# Patient Record
Sex: Male | Born: 1955
Health system: Southern US, Community
[De-identification: ages and names within clinical notes are randomized; demographics above are authoritative.]

## PROBLEM LIST (undated history)

## (undated) DIAGNOSIS — C801 Malignant (primary) neoplasm, unspecified: Secondary | ICD-10-CM

## (undated) DIAGNOSIS — N189 Chronic kidney disease, unspecified: Secondary | ICD-10-CM

## (undated) DIAGNOSIS — Z87448 Personal history of other diseases of urinary system: Secondary | ICD-10-CM

## (undated) DIAGNOSIS — R9431 Abnormal electrocardiogram [ECG] [EKG]: Secondary | ICD-10-CM

## (undated) DIAGNOSIS — R319 Hematuria, unspecified: Secondary | ICD-10-CM

## (undated) DIAGNOSIS — K219 Gastro-esophageal reflux disease without esophagitis: Secondary | ICD-10-CM

## (undated) DIAGNOSIS — E119 Type 2 diabetes mellitus without complications: Secondary | ICD-10-CM

## (undated) DIAGNOSIS — G4730 Sleep apnea, unspecified: Secondary | ICD-10-CM

## (undated) DIAGNOSIS — Z789 Other specified health status: Secondary | ICD-10-CM

## (undated) DIAGNOSIS — N35919 Unspecified urethral stricture, male, unspecified site: Secondary | ICD-10-CM

## (undated) DIAGNOSIS — I10 Essential (primary) hypertension: Secondary | ICD-10-CM

## (undated) HISTORY — PX: CHOLECYSTECTOMY: SHX55

## (undated) HISTORY — PX: OTHER SURGICAL HISTORY: SHX169

---

## 2005-12-06 ENCOUNTER — Ambulatory Visit: Admission: RE | Admit: 2005-12-06 | Discharge: 2006-03-06 | Payer: Self-pay | Admitting: Radiation Oncology

## 2011-11-12 ENCOUNTER — Ambulatory Visit (INDEPENDENT_AMBULATORY_CARE_PROVIDER_SITE_OTHER): Payer: BC Managed Care – PPO | Admitting: Urology

## 2011-11-12 DIAGNOSIS — C61 Malignant neoplasm of prostate: Secondary | ICD-10-CM

## 2011-11-12 DIAGNOSIS — N529 Male erectile dysfunction, unspecified: Secondary | ICD-10-CM

## 2011-11-12 DIAGNOSIS — IMO0002 Reserved for concepts with insufficient information to code with codable children: Secondary | ICD-10-CM

## 2011-11-12 DIAGNOSIS — R350 Frequency of micturition: Secondary | ICD-10-CM

## 2012-06-26 ENCOUNTER — Ambulatory Visit (INDEPENDENT_AMBULATORY_CARE_PROVIDER_SITE_OTHER): Payer: BC Managed Care – PPO | Admitting: Urology

## 2012-06-26 DIAGNOSIS — IMO0002 Reserved for concepts with insufficient information to code with codable children: Secondary | ICD-10-CM

## 2012-07-07 ENCOUNTER — Other Ambulatory Visit: Payer: Self-pay | Admitting: Urology

## 2012-07-09 ENCOUNTER — Encounter (HOSPITAL_COMMUNITY): Payer: Self-pay | Admitting: Pharmacy Technician

## 2012-07-10 ENCOUNTER — Ambulatory Visit (HOSPITAL_COMMUNITY)
Admission: RE | Admit: 2012-07-10 | Discharge: 2012-07-10 | Disposition: A | Discharge status: Home / Self Care | Payer: BC Managed Care – PPO | Source: Ambulatory Visit | Attending: Urology | Admitting: Urology

## 2012-07-10 ENCOUNTER — Encounter (HOSPITAL_COMMUNITY): Payer: Self-pay

## 2012-07-10 ENCOUNTER — Encounter (HOSPITAL_COMMUNITY)
Admission: RE | Admit: 2012-07-10 | Discharge: 2012-07-10 | Disposition: A | Discharge status: Home / Self Care | Payer: BC Managed Care – PPO | Source: Ambulatory Visit | Attending: Urology | Admitting: Urology

## 2012-07-10 DIAGNOSIS — Z01810 Encounter for preprocedural cardiovascular examination: Secondary | ICD-10-CM | POA: Insufficient documentation

## 2012-07-10 DIAGNOSIS — I498 Other specified cardiac arrhythmias: Secondary | ICD-10-CM | POA: Insufficient documentation

## 2012-07-10 DIAGNOSIS — I4949 Other premature depolarization: Secondary | ICD-10-CM | POA: Insufficient documentation

## 2012-07-10 DIAGNOSIS — R9431 Abnormal electrocardiogram [ECG] [EKG]: Secondary | ICD-10-CM | POA: Insufficient documentation

## 2012-07-10 DIAGNOSIS — Z01812 Encounter for preprocedural laboratory examination: Secondary | ICD-10-CM | POA: Insufficient documentation

## 2012-07-10 DIAGNOSIS — Z01818 Encounter for other preprocedural examination: Secondary | ICD-10-CM | POA: Insufficient documentation

## 2012-07-10 HISTORY — DX: Malignant (primary) neoplasm, unspecified: C80.1

## 2012-07-10 HISTORY — DX: Chronic kidney disease, unspecified: N18.9

## 2012-07-10 HISTORY — DX: Unspecified urethral stricture, male, unspecified site: N35.919

## 2012-07-10 HISTORY — DX: Gastro-esophageal reflux disease without esophagitis: K21.9

## 2012-07-10 HISTORY — DX: Essential (primary) hypertension: I10

## 2012-07-10 LAB — BASIC METABOLIC PANEL
BUN: 18 mg/dL (ref 6–23)
CO2: 27 mEq/L (ref 19–32)
Chloride: 106 mEq/L (ref 96–112)
GFR calc Af Amer: 54 mL/min — ABNORMAL LOW (ref >90)
Potassium: 3.9 mEq/L (ref 3.5–5.1)

## 2012-07-10 LAB — CBC
HCT: 38.4 % — ABNORMAL LOW (ref 39.0–52.0)
Platelets: 208 10*3/uL (ref 150–400)
RBC: 4.3 MIL/uL (ref 4.22–5.81)
RDW: 12.8 % (ref 11.5–15.5)
WBC: 4.9 10*3/uL (ref 4.0–10.5)

## 2012-07-10 LAB — SURGICAL PCR SCREEN: Staphylococcus aureus: NEGATIVE

## 2012-07-10 NOTE — Patient Instructions (Signed)
YOUR SURGERY IS SCHEDULED AT [facility]  ON:  Thursday  12/26  REPORT TO [place] [facility] AT:  10:00 AM      PHONE # FOR SHORT STAY IS [phone]  DO NOT EAT ANYTHING AFTER MIDNIGHT THE NIGHT BEFORE YOUR SURGERY.   NO FOOD, NO CHEWING GUM, NO MINTS, NO CANDIES, NO CHEWING TOBACCO. YOU MAY HAVE CLEAR LIQUIDS TO DRINK FROM MIDNIGHT THE NIGHT BEFORE YOUR SURGERY - UNTIL 6:00 AM DAY OF SURGERY - LIKE WATER, SODA.   DO NOT DRINK ANYTHING AFTER 6:00 AM DAY OF SURGERY.  PLEASE TAKE THE FOLLOWING MEDICATIONS THE AM OF YOUR SURGERY WITH A FEW SIPS OF WATER:  AMLODIPINE AND CARVEDILOL.  IF YOU USE INHALERS--USE YOUR INHALERS THE AM OF YOUR SURGERY AND BRING INHALERS TO THE HOSPITAL -TAKE TO SURGERY.    IF YOU ARE DIABETIC:  DO NOT TAKE ANY DIABETIC MEDICATIONS THE AM OF YOUR SURGERY.  IF YOU TAKE INSULIN IN THE EVENINGS--PLEASE ONLY TAKE 1/2 NORMAL EVENING DOSE THE NIGHT BEFORE YOUR SURGERY.  NO INSULIN THE AM OF YOUR SURGERY.  IF YOU HAVE SLEEP APNEA AND USE CPAP OR BIPAP--PLEASE BRING THE MASK AND THE TUBING.  DO NOT BRING YOUR MACHINE.  DO NOT BRING VALUABLES, MONEY, CREDIT CARDS.  DO NOT WEAR JEWELRY, MAKE-UP, NAIL POLISH AND NO METAL PINS OR CLIPS IN YOUR HAIR. CONTACT LENS, DENTURES / PARTIALS, GLASSES SHOULD NOT BE WORN TO SURGERY AND IN MOST CASES-HEARING AIDS WILL NEED TO BE REMOVED.  BRING YOUR GLASSES CASE, ANY EQUIPMENT NEEDED FOR YOUR CONTACT LENS. FOR PATIENTS ADMITTED TO THE HOSPITAL--CHECK OUT TIME THE DAY OF DISCHARGE IS 11:00 AM.  ALL INPATIENT ROOMS ARE PRIVATE - WITH BATHROOM, TELEPHONE, TELEVISION AND WIFI INTERNET.  IF YOU ARE BEING DISCHARGED THE SAME DAY OF YOUR SURGERY--YOU CAN NOT DRIVE YOURSELF HOME--AND SHOULD NOT GO HOME ALONE BY TAXI OR BUS.  NO DRIVING OR OPERATING MACHINERY FOR 24 HOURS FOLLOWING ANESTHESIA / PAIN MEDICATIONS.  PLEASE MAKE ARRANGEMENTS FOR SOMEONE TO BE WITH YOU AT HOME THE FIRST 24 HOURS AFTER SURGERY. RESPONSIBLE DRIVER'S NAME   WIFE WILL BE WITH PT-BUT DOES NOT DRIVE--PATIENT WILL MAKE ARRANGEMENTS FOR SOMEONE TO DRIVE HIM HOME AFTER SURGERY.                                                                          PLEASE READ OVER ANY  FACT SHEETS THAT YOU WERE GIVEN: MRSA INFORMATION, BLOOD TRANSFUSION INFORMATION, INCENTIVE SPIROMETER INFORMATION. FAILURE TO FOLLOW THESE INSTRUCTIONS MAY RESULT IN THE CANCELLATION OF YOUR SURGERY.   PATIENT SIGNATURE_________________________________

## 2012-07-10 NOTE — Progress Notes (Signed)
07/10/12 1344  OBSTRUCTIVE SLEEP APNEA  Have you ever been diagnosed with sleep apnea through a sleep study? No  Do you snore loudly (loud enough to be heard through closed doors)?  1  Do you often feel tired, fatigued, or sleepy during the daytime? 1  Has anyone observed you stop breathing during your sleep? 0  Do you have, or are you being treated for high blood pressure? 1  BMI more than 35 kg/m2? 1  Age over 58 years old? 1  Neck circumference greater than 40 cm/18 inches? 1  Gender: 1  Obstructive Sleep Apnea Score 7   Score 4 or greater  Results sent to PCP

## 2012-07-10 NOTE — Pre-Procedure Instructions (Signed)
PREOP CBC, BMET AND EKG AND CXR WERE DONE TODAY - PREOP AT [facility] AS PER ANESTHESIOLOGIST'S GUIDELINES.

## 2012-07-10 NOTE — Pre-Procedure Instructions (Signed)
PT'S PREOP PRELIMINARY EKG DONE TODAY AT [facility] SHOWN TO DR. ROSE--NO OLD EKG IN EPIC FOR COMPARISON.  DR. ROSE REQUESTED THAT I FAX TO PT'S MEDICAL DOCTOR AND ASK IF ANY OLD EKG FOR COMPARISON.  I HAVE FAXED EKG AND NOTE TO DR. D. TAPPER'S OFFICE ASKING FOR ANY OLD EKG FOR COMPARISON.   I FAXED PT'S PREOP BMET REPORT TO DR. Annabell Howells - CREAT 1.59 AND FAXED PREOP EKG TO DR. Annabell Howells WITH NOTE THAT I AM CHECKING WITH DR. TAPPER -PT'S MEDICAL DOCTOR FOR ANY OLD EKG FOR COMPARISON.

## 2012-07-13 ENCOUNTER — Encounter (HOSPITAL_COMMUNITY): Payer: Self-pay

## 2012-07-13 NOTE — Pre-Procedure Instructions (Signed)
PT'S MEDICAL DOCTOR FAXED NOTE THAT HE DID NOT HAVE ANY EKG'S FOR COMPARISON. PT'S MOST RECENT EKGS FROM [facility] IN [place] FROM 2008 RECEIVED AND SHOWN TO DR. EWELL ALONG WITH PREOP EKG--DR. EWELL STATES ANESTHESIOLOGIST DAY OF SURGERY WILL TALK WITH PT AND REVIEW THE EKGS. PT'S MOST RECENT OFFICE NOTE OF 05/26/12 FROM DR. BEFEKADU-PT'S NEPHROLOGIST - ON PT'S CHART.

## 2012-07-14 NOTE — H&P (Signed)
ctive Problems 1. Balanitis 607.1 2. Organic Impotence 607.84 3. Prostate Cancer 185 4. Urethral Stricture 598.9 5. Urinary Frequency 788.41  History of Present Illness     [name] returns today with a one month history of progressive voiding difficulty with straining to void and a very slow stream.  He has no dysuria or hematuria.  He has a history of urethral strictures with prior dilations in the office. He has no associated signs or symptoms.     He has a history of T1c prostate cancer.   He was initially diagnosed with adenocarcinoma prostate on biopsy April 2007 by Dr. Jesse Werner. PSA was 5.3. He was referred to Dr. Chipper Werner who did external radiation therapy completing this August 2007. His PSA prior to this visit is 0.24 which is a further decline.  He had a history of retention post radiation and had a cystoscopy and dilation by Dr. Jerre Werner in 2009.  He had another dilation by Dr. Aldean Werner in 2010.  He has still has erections sufficient for intercourse and is no longer using Viagra.   Past Medical History 1. History of  Adult Sleep Apnea 780.57 2. History of  Arthritis V13.4 3. History of  Diabetes Mellitus 250.00 4. History of  Gross Hematuria 599.71 5. History of  Hypercholesterolemia 272.0 6. History of  Hypertension 401.9 7. History of  Microscopic Hematuria 599.72 8. History of  Microscopic Hematuria 599.72 9. History of  Prostate Cancer V10.46 10. History of  Renal Failure 586 11. History of  Visit For: Exam Following Radiotherapy V67.1  Surgical History 1. History of  Hand Surgery 2. History of  Hip Surgery 3. History of  Knee Surgery  Current Meds 1. AmLODIPine Besylate TABS; Therapy: (Recorded:06Dec2013) to 2. Aspirin 81 MG Oral Tablet; Therapy: (Recorded:16Aug2010) to 3. BD Insulin Syr Ultrafine II 31G X 5/16" 0.5 ML Miscellaneous; Therapy: 08Jan2011 to 4. Benazepril HCl 20 MG Oral Tablet; Therapy: (Recorded:16Aug2010) to 5. Carvedilol 6.25 MG Oral Tablet;  Therapy: (Recorded:16Aug2010) to 6. Furosemide 80 MG Oral Tablet; Therapy: (Recorded:16Aug2010) to 7. Insulin; Therapy: (Recorded:16Aug2010) to 8. Lantus SoloStar SOLN; Therapy: (Recorded:23Apr2013) to 9. Meloxicam 7.5 MG Oral Tablet; Therapy: 14Feb2012 to 10. Nystatin-Triamcinolone 100000-0.1 UNIT/GM-% External Ointment; APPLY SPARINGLY TO   AFFECTED AREA TWICE A DAY FOR 7 TO 14 DAYS; Therapy: 04Apr2012 to (Last Rx:04Apr2012) 11. Polymyxin B-Trimethoprim 10000-0.1 UNIT/ML-% Ophthalmic Solution; Therapy: 03Feb2012 to 12. Pravastatin Sodium 40 MG Oral Tablet; Therapy: 04Sep2010 to 13. Viagra 100 MG Oral Tablet; TAKE 1 TABLET PRN PRN; Therapy: 22Nov2010 to (Last   Rx:22Nov2010)  Allergies 1. No Known Drug Allergies  Family History 1. Maternal history of  Death In The Family Father 2. Maternal history of  Death In The Family Mother 3. Family history of  Diabetes Mellitus V18.0 4. Maternal history of  Family Health Status Number Of Children 5. Paternal history of  Hypertension V17.49  Social History 1. Caffeine Use 2-3 soda per day 2. Marital History - Currently Married 3. Occupation: spinner in [employer]  4. History of  Alcohol Use 5. History of  Tobacco Use  Review of Systems  Gastrointestinal: no abdominal pain.  Constitutional: no fever.  Cardiovascular: leg swelling, but no chest pain.  Respiratory: no shortness of breath.    Vitals Vital Signs [Data Includes: Last 1 Day]  06Dec2013 03:14PM  Blood Pressure: 152 / 74 Temperature: 97.9 F Heart Rate: 64  Physical Exam Constitutional: Well nourished and well developed . No acute distress.  Pulmonary: No respiratory distress  and normal respiratory rhythm and effort.  Cardiovascular: Heart rate and rhythm are normal . No peripheral edema.  Abdomen: The abdomen is obese. The abdomen is soft and nontender.  No inguinal hernia is present on the right.  No inguinal hernia is present on the left.  Genitourinary:  Examination of the penis demonstrates no discharge, no masses, no lesions and a normal meatus. The scrotum is without lesions. The right epididymis is palpably normal and non-tender. The left epididymis is palpably normal and non-tender. The right testis is non-tender and without masses. The left testis is non-tender and without masses.    Results/Data Urine [Data Includes: Last 1 Day]   06Dec2013  COLOR YELLOW   APPEARANCE CLEAR   SPECIFIC GRAVITY 1.010   pH 5.5   GLUCOSE NEG mg/dL  BILIRUBIN NEG   KETONE NEG mg/dL  BLOOD TRACE   PROTEIN NEG mg/dL  UROBILINOGEN 0.2 mg/dL  NITRITE NEG   LEUKOCYTE ESTERASE NEG   SQUAMOUS EPITHELIAL/HPF NONE SEEN   WBC NONE SEEN WBC/hpf  RBC 3-6 RBC/hpf  BACTERIA NONE SEEN   CRYSTALS NONE SEEN   CASTS NONE SEEN    Flow Rate: Voided 104 ml. A peak flow rate of 82ml/s, mean flow rate of 8ml/s and sustained but very slow. flow curve .  PVR: Ultrasound PVR 120 ml.    Procedure  Procedure: Cystoscopy   Indication: Lower Urinary Tract Symptoms.  Prep: The patient was prepped with betadine.  Anesthesia:. Local anesthesia was administered intraurethrally with 2% lidocaine jelly.  Antibiotic prophylaxis: Ciprofloxacin.  Procedure Note:  Urethral meatus:. No abnormalities.  Anterior urethra:. A severe stricture was present (in the distal bulb there were sequential strictures with the more proximal being about 6 fr. After the scope was passed a flap was raised and I could not pass a wire beyond the obliterated area. The procedure was abort and fortunately he was able to void better than prior to the cysto attempt. ) in the bulbar urethra and an attempt was made to dilate the stricture but was unsuccessful.  Prostatic urethra:. Not seen.  Bladder: Not seen. Not seen. The patient tolerated the procedure well.  Complications: Bleeding. False passage.    Assessment 1. Urethral Stricture 598.9   He has a severe stricture that will need management in the OR  with balloon dilation.   He was able to void after the procedure.   Plan Prostate Cancer (185)  1. UA With REFLEX  Done: 06Dec2013 03:24PM   Because he was able to void better post procedure, I am going to schedule him electively for cystoscopy with urethral dilation in the OR in [place] in the next one - two weeks.  I have told him to go to the [facility] ER if he can't pee and to let them know I said he would need a foley placed in the OR.  I reviewed the risks of the procedure including bleeding, infection, urethral and penile injury, need for SP tube and secondary procedures, thrombotic events and anesthetic complications.   Discussion/Summary   CC: Dr. Burns Spain

## 2012-07-16 ENCOUNTER — Ambulatory Visit (HOSPITAL_COMMUNITY): Payer: BC Managed Care – PPO | Admitting: Anesthesiology

## 2012-07-16 ENCOUNTER — Encounter (HOSPITAL_COMMUNITY)
Admission: RE | Disposition: A | Discharge status: Home / Self Care | Payer: Self-pay | Source: Ambulatory Visit | Attending: Urology

## 2012-07-16 ENCOUNTER — Encounter (HOSPITAL_COMMUNITY): Payer: Self-pay | Admitting: *Deleted

## 2012-07-16 ENCOUNTER — Encounter (HOSPITAL_COMMUNITY): Payer: Self-pay | Admitting: Anesthesiology

## 2012-07-16 ENCOUNTER — Ambulatory Visit (HOSPITAL_COMMUNITY)
Admission: RE | Admit: 2012-07-16 | Discharge: 2012-07-16 | Disposition: A | Discharge status: Home / Self Care | Payer: BC Managed Care – PPO | Source: Ambulatory Visit | Attending: Urology | Admitting: Urology

## 2012-07-16 DIAGNOSIS — N476 Balanoposthitis: Secondary | ICD-10-CM | POA: Insufficient documentation

## 2012-07-16 DIAGNOSIS — I10 Essential (primary) hypertension: Secondary | ICD-10-CM | POA: Insufficient documentation

## 2012-07-16 DIAGNOSIS — Z794 Long term (current) use of insulin: Secondary | ICD-10-CM | POA: Insufficient documentation

## 2012-07-16 DIAGNOSIS — E7800 Pure hypercholesterolemia, unspecified: Secondary | ICD-10-CM | POA: Insufficient documentation

## 2012-07-16 DIAGNOSIS — Z79899 Other long term (current) drug therapy: Secondary | ICD-10-CM | POA: Insufficient documentation

## 2012-07-16 DIAGNOSIS — N35919 Unspecified urethral stricture, male, unspecified site: Secondary | ICD-10-CM | POA: Insufficient documentation

## 2012-07-16 DIAGNOSIS — R350 Frequency of micturition: Secondary | ICD-10-CM | POA: Insufficient documentation

## 2012-07-16 DIAGNOSIS — N529 Male erectile dysfunction, unspecified: Secondary | ICD-10-CM | POA: Insufficient documentation

## 2012-07-16 DIAGNOSIS — C61 Malignant neoplasm of prostate: Secondary | ICD-10-CM | POA: Insufficient documentation

## 2012-07-16 DIAGNOSIS — E119 Type 2 diabetes mellitus without complications: Secondary | ICD-10-CM | POA: Insufficient documentation

## 2012-07-16 DIAGNOSIS — G4730 Sleep apnea, unspecified: Secondary | ICD-10-CM | POA: Insufficient documentation

## 2012-07-16 DIAGNOSIS — Z7982 Long term (current) use of aspirin: Secondary | ICD-10-CM | POA: Insufficient documentation

## 2012-07-16 HISTORY — PX: CYSTOSCOPY WITH URETHRAL DILATATION: SHX5125

## 2012-07-16 LAB — GLUCOSE, CAPILLARY
Glucose-Capillary: 221 mg/dL — ABNORMAL HIGH (ref 70–99)
Glucose-Capillary: 218 mg/dL — ABNORMAL HIGH (ref 70–99)

## 2012-07-16 SURGERY — CYSTOSCOPY, WITH URETHRAL DILATION
Anesthesia: General | Wound class: Clean Contaminated

## 2012-07-16 MED ORDER — ACETAMINOPHEN 10 MG/ML IV SOLN
INTRAVENOUS | Status: AC
  Filled 2012-07-16: qty 100

## 2012-07-16 MED ORDER — STERILE WATER FOR IRRIGATION IR SOLN
Status: DC | PRN
  Administered 2012-07-16: 10 mL

## 2012-07-16 MED ORDER — INSULIN ASPART 100 UNIT/ML ~~LOC~~ SOLN
0.0000 [IU] | SUBCUTANEOUS | Status: DC
  Administered 2012-07-16: 5 [IU] via SUBCUTANEOUS
  Filled 2012-07-16: qty 1

## 2012-07-16 MED ORDER — CIPROFLOXACIN IN D5W 400 MG/200ML IV SOLN
400.0000 mg | INTRAVENOUS | Status: AC
  Administered 2012-07-16: 400 mg via INTRAVENOUS

## 2012-07-16 MED ORDER — ONDANSETRON HCL 4 MG/2ML IJ SOLN
INTRAMUSCULAR | Status: DC | PRN
  Administered 2012-07-16: 4 mg via INTRAVENOUS

## 2012-07-16 MED ORDER — IOHEXOL 300 MG/ML  SOLN
INTRAMUSCULAR | Status: DC | PRN
  Administered 2012-07-16: 50 mL

## 2012-07-16 MED ORDER — 0.9 % SODIUM CHLORIDE (POUR BTL) OPTIME
TOPICAL | Status: DC | PRN
  Administered 2012-07-16: 1000 mL

## 2012-07-16 MED ORDER — HYDROCODONE-ACETAMINOPHEN 5-325 MG PO TABS: 1.0000 | ORAL_TABLET | Freq: Four times a day (QID) | ORAL | Status: DC | PRN

## 2012-07-16 MED ORDER — LACTATED RINGERS IV SOLN: INTRAVENOUS | Status: DC

## 2012-07-16 MED ORDER — LACTATED RINGERS IV SOLN
INTRAVENOUS | Status: DC
  Administered 2012-07-16: 1000 mL via INTRAVENOUS

## 2012-07-16 MED ORDER — LIDOCAINE HCL 1 % IJ SOLN
INTRAMUSCULAR | Status: DC | PRN
  Administered 2012-07-16: 80 mg via INTRADERMAL

## 2012-07-16 MED ORDER — LACTATED RINGERS IV SOLN
INTRAVENOUS | Status: DC | PRN
  Administered 2012-07-16: 11:00:00 via INTRAVENOUS

## 2012-07-16 MED ORDER — PROPOFOL 10 MG/ML IV EMUL
INTRAVENOUS | Status: DC | PRN
  Administered 2012-07-16: 250 mg via INTRAVENOUS

## 2012-07-16 MED ORDER — IOHEXOL 300 MG/ML  SOLN
INTRAMUSCULAR | Status: AC
  Filled 2012-07-16: qty 2

## 2012-07-16 MED ORDER — CIPROFLOXACIN IN D5W 400 MG/200ML IV SOLN
INTRAVENOUS | Status: AC
  Filled 2012-07-16: qty 200

## 2012-07-16 MED ORDER — FENTANYL CITRATE 0.05 MG/ML IJ SOLN
INTRAMUSCULAR | Status: DC | PRN
  Administered 2012-07-16: 50 ug via INTRAVENOUS
  Administered 2012-07-16: 25 ug via INTRAVENOUS
  Administered 2012-07-16: 50 ug via INTRAVENOUS

## 2012-07-16 MED ORDER — FENTANYL CITRATE 0.05 MG/ML IJ SOLN: 25.0000 ug | INTRAMUSCULAR | Status: DC | PRN

## 2012-07-16 MED ORDER — SODIUM CHLORIDE 0.9 % IR SOLN
Status: DC | PRN
  Administered 2012-07-16: 3000 mL

## 2012-07-16 MED ORDER — ACETAMINOPHEN 10 MG/ML IV SOLN
INTRAVENOUS | Status: DC | PRN
  Administered 2012-07-16: 1000 mg via INTRAVENOUS

## 2012-07-16 SURGICAL SUPPLY — 15 items
BAG URINE DRAINAGE (UROLOGICAL SUPPLIES) ×2 IMPLANT
BAG URO CATCHER STRL LF (DRAPE) ×2 IMPLANT
BALLN NEPHROSTOMY (BALLOONS) ×2
BALLOON NEPHROSTOMY (BALLOONS) ×1 IMPLANT
CATH FOLEY 2W COUNCIL 5CC 18FR (CATHETERS) ×2 IMPLANT
CATH ROBINSON RED A/P 16FR (CATHETERS) IMPLANT
CLOTH BEACON ORANGE TIMEOUT ST (SAFETY) ×2 IMPLANT
DRAPE CAMERA CLOSED 9X96 (DRAPES) ×2 IMPLANT
GLOVE SURG SS PI 8.0 STRL IVOR (GLOVE) ×2 IMPLANT
GOWN PREVENTION PLUS XLARGE (GOWN DISPOSABLE) ×2 IMPLANT
GOWN STRL REIN XL XLG (GOWN DISPOSABLE) ×2 IMPLANT
GUIDEWIRE STR DUAL SENSOR (WIRE) ×2 IMPLANT
MANIFOLD NEPTUNE II (INSTRUMENTS) ×2 IMPLANT
PACK CYSTO (CUSTOM PROCEDURE TRAY) ×2 IMPLANT
TUBING CONNECTING 10 (TUBING) ×2 IMPLANT

## 2012-07-16 NOTE — Anesthesia Postprocedure Evaluation (Signed)
  Anesthesia Post-op Note  Patient: Seth Werner  Procedure(s) Performed: Procedure(s) (LRB): CYSTOSCOPY WITH URETHRAL DILATATION (N/A)  Patient Location: PACU  Anesthesia Type: General  Level of Consciousness: awake and alert   Airway and Oxygen Therapy: Patient Spontanous Breathing  Post-op Pain: mild  Post-op Assessment: Post-op Vital signs reviewed, Patient's Cardiovascular Status Stable, Respiratory Function Stable, Patent Airway and No signs of Nausea or vomiting  Last Vitals:  Filed Vitals:   07/16/12 1230  BP: 130/70  Pulse: 50  Temp:   Resp: 14    Post-op Vital Signs: stable   Complications: No apparent anesthesia complications

## 2012-07-16 NOTE — Anesthesia Preprocedure Evaluation (Addendum)
Anesthesia Evaluation  Patient identified by MRN, date of birth, ID band Patient awake    Reviewed: Allergy & Precautions, H&P , NPO status , Patient's Chart, lab work & pertinent test results, reviewed documented beta blocker date and time   Airway Mallampati: III TM Distance: >3 FB Neck ROM: full    Dental No notable dental hx. (+) Teeth Intact and Dental Advisory Given   Pulmonary neg pulmonary ROS,  breath sounds clear to auscultation  Pulmonary exam normal       Cardiovascular Exercise Tolerance: Good hypertension, Pt. on medications and Pt. on home beta blockers negative cardio ROS  Rhythm:regular Rate:Normal     Neuro/Psych negative neurological ROS  negative psych ROS   GI/Hepatic negative GI ROS, Neg liver ROS, GERD-  Controlled,  Endo/Other  negative endocrine ROSdiabetes, Well Controlled, Type 2, Insulin Dependent  Renal/GU Renal InsufficiencyRenal diseasenegative Renal ROSKidneys improving now  negative genitourinary   Musculoskeletal   Abdominal   Peds  Hematology negative hematology ROS (+)   Anesthesia Other Findings   Reproductive/Obstetrics negative OB ROS                          Anesthesia Physical Anesthesia Plan  ASA: III  Anesthesia Plan: General   Post-op Pain Management:    Induction: Intravenous  Airway Management Planned: LMA  Additional Equipment:   Intra-op Plan:   Post-operative Plan:   Informed Consent: I have reviewed the patients History and Physical, chart, labs and discussed the procedure including the risks, benefits and alternatives for the proposed anesthesia with the patient or authorized representative who has indicated his/her understanding and acceptance.   Dental Advisory Given  Plan Discussed with: CRNA and Surgeon  Anesthesia Plan Comments:         Anesthesia Quick Evaluation

## 2012-07-16 NOTE — Interval H&P Note (Signed)
History and Physical Interval Note:  07/16/2012 11:15 AM  Seth Werner  has presented today for surgery, with the diagnosis of URETHRAL STRICTURE  The various methods of treatment have been discussed with the patient and family. After consideration of risks, benefits and other options for treatment, the patient has consented to  Procedure(s) (LRB) with comments: CYSTOSCOPY WITH URETHRAL DILATATION (N/A) - CYSTO URETHRAL BALLOON DILATATION  as a surgical intervention .  The patient's history has been reviewed, patient examined, no change in status, stable for surgery.  I have reviewed the patient's chart and labs.  Questions were answered to the patient's satisfaction.     [name],[name]

## 2012-07-16 NOTE — Brief Op Note (Signed)
07/16/2012  12:10 PM  PATIENT:  Seth Werner  56 y.o. male  PRE-OPERATIVE DIAGNOSIS:  URETHRAL STRICTURE  POST-OPERATIVE DIAGNOSIS:  URETHRAL STRICTURE  PROCEDURE:  Procedure(s) (LRB) with comments: CYSTOSCOPY WITH URETHRAL DILATATION (N/A) - CYSTO URETHRAL BALLOON DILATATION   SURGEON:  Surgeon(s) and Role:    * Anner Crete, MD - Primary  PHYSICIAN ASSISTANT:   ASSISTANTS: none   ANESTHESIA:   general  EBL:     BLOOD ADMINISTERED:none  DRAINS: Urinary Catheter (Foley)   LOCAL MEDICATIONS USED:  NONE  SPECIMEN:  No Specimen  DISPOSITION OF SPECIMEN:  N/A  COUNTS:  YES  TOURNIQUET:  * No tourniquets in log *  DICTATION: .Other Dictation: Dictation Number [id number]  PLAN OF CARE: Discharge to home after PACU  PATIENT DISPOSITION:  PACU - hemodynamically stable.   Delay start of Pharmacological VTE agent (>24hrs) due to surgical blood loss or risk of bleeding: not applicable

## 2012-07-16 NOTE — Transfer of Care (Signed)
Immediate Anesthesia Transfer of Care Note  Patient: Seth Werner  Procedure(s) Performed: Procedure(s) (LRB) with comments: CYSTOSCOPY WITH URETHRAL DILATATION (N/A) - CYSTO URETHRAL BALLOON DILATATION   Patient Location: PACU  Anesthesia Type:General  Level of Consciousness: awake  Airway & Oxygen Therapy: Patient Spontanous Breathing and Patient connected to face mask oxygen  Post-op Assessment: Report given to PACU RN and Post -op Vital signs reviewed and stable  Post vital signs: Reviewed and stable  Complications: No apparent anesthesia complications

## 2012-07-16 NOTE — Preoperative (Signed)
Beta Blockers   Reason not to administer Beta Blockers:Not Applicable 

## 2012-07-16 NOTE — Op Note (Signed)
NAMEMarland Kitchen  Seth Werner, Seth Werner NO.:  [id number]  MEDICAL RECORD NO.:  [id number]  LOCATION:  [facility]                         FACILITY:  [facility]  PHYSICIAN:  Taeden Geller. Annabell Howells, M.D.    DATE OF BIRTH:  1956-05-02  DATE OF PROCEDURE:  07/16/2012 DATE OF DISCHARGE:  07/16/2012                              OPERATIVE REPORT   PROCEDURE:  Cystoscopy with balloon dilation of urethral stricture.  PREOPERATIVE DIAGNOSIS:  Urethral stricture.  POSTOPERATIVE DIAGNOSIS:  Urethral stricture.  SURGEON:  Rocklin Soderquist. Annabell Howells, M.D.  ANESTHESIA:  General.  SPECIMEN:  None.  DRAINS:  An 18-French Council catheter.  COMPLICATIONS:  None.  INDICATIONS:  Seth Werner is a 56 year old African American male with history of prostate cancer, treated with external beam radiation therapy.  He subsequently has developed a severe bulbar urethral stricture, that has previously been dilated.  An attempt to evaluate the stricture in the office, the cystoscopy resulted an elevation of a mucosal flap, which prevented placement of a wire or attempted office dilation and he returns to the operating room today for definitive therapy.  FINDINGS OF PROCEDURE:  He was taken to the operating room where received Cipro.  General anesthetic was induced.  He was placed in lithotomy position.  His perineum and genitalia were prepped with Betadine solution.  He was draped in usual sterile fashion.  Cystoscopy was initially performed with a 22-French scope and 12-degree lens.  Examination revealed a normal meatus and distal one-third of the urethra.  Then, he began to have the presence of stricture that was not initially tight, but tapered in such a way that passage of the scope to near the tight spot of the stricture was not possible.  At this point, I took a 6.4-French short rigid ureteroscope and advanced it through the urethra to the stricture ostia and passed a guidewire into the bladder.  This was confirmed with  fluoroscopy.  A 15-cm 24- French high-pressure balloon was then passed over the wire into the bladder.  There were some resistance at the tightest part of the stricture, but I was able to advance it across the membranous urethra, and once in position, the balloon was dilated to 20 atmospheres and left in place for 3 minutes.  The balloon was then deflated.  Fluoroscopy had revealed disappearance of the waist.  Once the proximal urethra had been dilated, ran another dilation cycle on the mid and anterior urethra to be certain that the entire stricture had been disrupted.  At this point, the balloon was removed and endoscopy was repeated with the 22-French cystoscope.  I was able to advance the scope along the wire into the bladder.  Both the prostatic and proximal urethral areas were rather fixed, which made inspection of the bladder difficulty, I could see the entire bladder wall with the bladder decompressed, but not when full.  On removal of the scope, there was excellent disruption of the stricture, but the stricture was rather elongated approximately 3-4 cm in length with dense scar circumferentially.  After removal of the scope, an 18-French Council catheter was inserted over the wire without difficulty.  The balloon was filled with  10 mL of sterile fluid.  The wire was removed.  The fluid return was clear.  The catheter was placed to straight drainage.  The patient was taken down from lithotomy position.  His anesthetic was reversed and he was removed to the recovery room in stable condition.  There were no complications.     [name]. Annabell Howells, M.D.     JJW/MEDQ  D:  07/16/2012  T:  07/16/2012  Job:  [id number]

## 2012-07-17 ENCOUNTER — Encounter (HOSPITAL_COMMUNITY): Payer: Self-pay | Admitting: Urology

## 2012-08-14 ENCOUNTER — Ambulatory Visit (INDEPENDENT_AMBULATORY_CARE_PROVIDER_SITE_OTHER): Payer: BC Managed Care – PPO | Admitting: Urology

## 2012-08-14 DIAGNOSIS — IMO0002 Reserved for concepts with insufficient information to code with codable children: Secondary | ICD-10-CM

## 2012-08-14 DIAGNOSIS — C61 Malignant neoplasm of prostate: Secondary | ICD-10-CM

## 2012-09-18 ENCOUNTER — Ambulatory Visit (INDEPENDENT_AMBULATORY_CARE_PROVIDER_SITE_OTHER): Payer: BC Managed Care – PPO | Admitting: Urology

## 2012-09-18 DIAGNOSIS — N35919 Unspecified urethral stricture, male, unspecified site: Secondary | ICD-10-CM

## 2013-01-01 ENCOUNTER — Encounter (HOSPITAL_COMMUNITY): Admission: EM | Disposition: A | Discharge status: Home / Self Care | Payer: Self-pay | Source: Home / Self Care

## 2013-01-01 ENCOUNTER — Encounter (HOSPITAL_COMMUNITY): Payer: Self-pay | Admitting: Anesthesiology

## 2013-01-01 ENCOUNTER — Ambulatory Visit (INDEPENDENT_AMBULATORY_CARE_PROVIDER_SITE_OTHER): Payer: BC Managed Care – PPO | Admitting: Urology

## 2013-01-01 ENCOUNTER — Ambulatory Visit: Admit: 2013-01-01 | Payer: BC Managed Care – PPO | Admitting: Urology

## 2013-01-01 ENCOUNTER — Emergency Department (HOSPITAL_COMMUNITY)
Admission: EM | Admit: 2013-01-01 | Discharge: 2013-01-01 | Disposition: A | Discharge status: Home / Self Care | Payer: BC Managed Care – PPO | Attending: Urology | Admitting: Urology

## 2013-01-01 DIAGNOSIS — Z8546 Personal history of malignant neoplasm of prostate: Secondary | ICD-10-CM | POA: Insufficient documentation

## 2013-01-01 DIAGNOSIS — R339 Retention of urine, unspecified: Secondary | ICD-10-CM | POA: Insufficient documentation

## 2013-01-01 DIAGNOSIS — R338 Other retention of urine: Secondary | ICD-10-CM

## 2013-01-01 DIAGNOSIS — N35919 Unspecified urethral stricture, male, unspecified site: Secondary | ICD-10-CM | POA: Insufficient documentation

## 2013-01-01 DIAGNOSIS — IMO0002 Reserved for concepts with insufficient information to code with codable children: Secondary | ICD-10-CM

## 2013-01-01 HISTORY — PX: CYSTOSCOPY WITH URETHRAL DILATATION: SHX5125

## 2013-01-01 SURGERY — CYSTOSCOPY, WITH URETHRAL DILATION
Anesthesia: General | Wound class: Clean Contaminated

## 2013-01-01 MED ORDER — CIPROFLOXACIN HCL 500 MG PO TABS: 500.0000 mg | ORAL_TABLET | Freq: Two times a day (BID) | ORAL | Status: DC

## 2013-01-01 MED ORDER — SODIUM CHLORIDE 0.9 % IV SOLN: 250.0000 mL | INTRAVENOUS | Status: DC | PRN

## 2013-01-01 MED ORDER — LIDOCAINE HCL 1 % IJ SOLN
INTRAMUSCULAR | Status: DC | PRN
  Administered 2013-01-01: 50 mg via INTRADERMAL

## 2013-01-01 MED ORDER — OXYCODONE HCL 5 MG PO TABS: 5.0000 mg | ORAL_TABLET | ORAL | Status: DC | PRN

## 2013-01-01 MED ORDER — FENTANYL CITRATE 0.05 MG/ML IJ SOLN: 25.0000 ug | INTRAMUSCULAR | Status: DC | PRN

## 2013-01-01 MED ORDER — ACETAMINOPHEN 325 MG PO TABS: 650.0000 mg | ORAL_TABLET | ORAL | Status: DC | PRN

## 2013-01-01 MED ORDER — HYDROCODONE-ACETAMINOPHEN 5-325 MG PO TABS: 1.0000 | ORAL_TABLET | Freq: Four times a day (QID) | ORAL | Status: DC | PRN

## 2013-01-01 MED ORDER — SODIUM CHLORIDE 0.9 % IJ SOLN: 3.0000 mL | Freq: Two times a day (BID) | INTRAMUSCULAR | Status: DC

## 2013-01-01 MED ORDER — FENTANYL CITRATE 0.05 MG/ML IJ SOLN
INTRAMUSCULAR | Status: DC | PRN
  Administered 2013-01-01 (×2): 50 ug via INTRAVENOUS

## 2013-01-01 MED ORDER — ACETAMINOPHEN 650 MG RE SUPP: 650.0000 mg | RECTAL | Status: DC | PRN

## 2013-01-01 MED ORDER — DEXTROSE 5 % IV SOLN
INTRAVENOUS | Status: DC | PRN
  Administered 2013-01-01: 18:00:00 via INTRAVENOUS

## 2013-01-01 MED ORDER — MIDAZOLAM HCL 5 MG/5ML IJ SOLN
INTRAMUSCULAR | Status: DC | PRN
  Administered 2013-01-01: 2 mg via INTRAVENOUS

## 2013-01-01 MED ORDER — SODIUM CHLORIDE 0.9 % IJ SOLN: 3.0000 mL | INTRAMUSCULAR | Status: DC | PRN

## 2013-01-01 MED ORDER — SODIUM CHLORIDE 0.9 % IR SOLN
Status: DC | PRN
  Administered 2013-01-01: 3000 mL via INTRAVESICAL

## 2013-01-01 MED ORDER — ONDANSETRON HCL 4 MG/2ML IJ SOLN: 4.0000 mg | Freq: Four times a day (QID) | INTRAMUSCULAR | Status: DC | PRN

## 2013-01-01 MED ORDER — PROPOFOL 10 MG/ML IV BOLUS
INTRAVENOUS | Status: DC | PRN
  Administered 2013-01-01: 200 mg via INTRAVENOUS

## 2013-01-01 MED ORDER — SURGILUBE EX GEL
CUTANEOUS | Status: DC | PRN
  Administered 2013-01-01: 1 via TOPICAL

## 2013-01-01 MED ORDER — ONDANSETRON HCL 4 MG/2ML IJ SOLN
INTRAMUSCULAR | Status: DC | PRN
  Administered 2013-01-01: 4 mg via INTRAVENOUS

## 2013-01-01 MED ORDER — CIPROFLOXACIN IN D5W 400 MG/200ML IV SOLN
400.0000 mg | INTRAVENOUS | Status: AC
  Administered 2016-09-11: 400 mg via INTRAVENOUS

## 2013-01-01 MED ORDER — SODIUM CHLORIDE 0.9 % IV SOLN
INTRAVENOUS | Status: DC | PRN
  Administered 2013-01-01: 17:00:00 via INTRAVENOUS

## 2013-01-01 MED ORDER — CIPROFLOXACIN IN D5W 400 MG/200ML IV SOLN
INTRAVENOUS | Status: DC | PRN
  Administered 2013-01-01: 400 mg via INTRAVENOUS

## 2013-01-01 SURGICAL SUPPLY — 16 items
BAG DRAIN CYSTO-URO STER (DRAIN) ×2 IMPLANT
BAG URINE DRAINAGE (UROLOGICAL SUPPLIES) ×2 IMPLANT
BALLN KIT 18FRX10 (BALLOONS) ×2 IMPLANT
CATH COUDE FOLEY 2W 5CC 16FR (CATHETERS) ×2 IMPLANT
GLOVE BIOGEL PI IND STRL 6.5 (GLOVE) ×1 IMPLANT
GLOVE BIOGEL PI INDICATOR 6.5 (GLOVE) ×1
GLOVE EXAM NITRILE MD LF STRL (GLOVE) ×4 IMPLANT
GLOVE INDICATOR 7.0 STRL GRN (GLOVE) ×2 IMPLANT
GLOVE SS BIOGEL STRL SZ 6.5 (GLOVE) ×1 IMPLANT
GLOVE SUPERSENSE BIOGEL SZ 6.5 (GLOVE) ×1
GLOVE SURG SS PI 8.0 STRL IVOR (GLOVE) ×2 IMPLANT
GUIDEWIRE STR DUAL SENSOR (WIRE) ×2 IMPLANT
IV NS IRRIG 3000ML ARTHROMATIC (IV SOLUTION) ×2 IMPLANT
PACK CYSTO (CUSTOM PROCEDURE TRAY) ×2 IMPLANT
PAD ARMBOARD 7.5X6 YLW CONV (MISCELLANEOUS) ×2 IMPLANT
TOWEL OR 17X26 4PK STRL BLUE (TOWEL DISPOSABLE) ×2 IMPLANT

## 2013-01-01 NOTE — Anesthesia Preprocedure Evaluation (Addendum)
Anesthesia Evaluation  Patient identified by MRN, date of birth, ID band Patient awake    Reviewed: Allergy & Precautions, H&P , NPO status , Patient's Chart, lab work & pertinent test results, reviewed documented beta blocker date and time   Airway Mallampati: I TM Distance: >3 FB Neck ROM: full    Dental  (+) Teeth Intact   Pulmonary neg pulmonary ROS,  breath sounds clear to auscultation  Pulmonary exam normal       Cardiovascular hypertension, Pt. on home beta blockers Rhythm:regular Rate:Normal     Neuro/Psych    GI/Hepatic GERD-  Controlled and Medicated,  Endo/Other  diabetes, Well Controlled, Type 1, Insulin DependentMorbid obesity  Renal/GU Renal disease     Musculoskeletal   Abdominal   Peds  Hematology   Anesthesia Other Findings   Reproductive/Obstetrics                          Anesthesia Physical Anesthesia Plan  ASA: III and emergent  Anesthesia Plan: General and General LMA   Post-op Pain Management:    Induction: Intravenous  Airway Management Planned: LMA  Additional Equipment:   Intra-op Plan:   Post-operative Plan: Extubation in OR  Informed Consent:   Plan Discussed with: Anesthesiologist  Anesthesia Plan Comments:        Anesthesia Quick Evaluation

## 2013-01-01 NOTE — H&P (Signed)
ctive Problems 1. Balanitis 607.1 2. Gross Hematuria 599.71 3. Incomplete Emptying Of Bladder 788.21 4. 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 Prostate Cancer 185 6. Urethral Stricture 598.9 7. Urinary Frequency 788.41 8. Urinary Stream Is Smaller 788.62  History of Present Illness  Seth Werner returns today with the acute onset today of inability to void.   He has a membranous stricture that has been dilated 2-3 x and he was voiding ok with a slightly reduced stream up until today.   He has had no hematuria or dysuria.   He has no SP pain.   He has no associated signs or symptoms.   Past Medical History 1. History of  Adult Sleep Apnea 780.57 2. History of  Arthritis V13.4 3. History of  Diabetes Mellitus 250.00 4. History of  Gross Hematuria 599.71 5. History of  Hypercholesterolemia 272.0 6. History of  Hypertension 401.9 7. History of  Microscopic Hematuria 599.72 8. History of  Microscopic Hematuria 599.72 9. History of  Prostate Cancer V10.46 10. History of  Renal Failure 586 11. History of  Visit For: Exam Following Radiotherapy V67.1  Surgical History 1. History of  Cystoscopy For Urethral Stricture 2. History of  Hand Surgery 3. History of  Hip Surgery 4. History of  Knee Surgery  Current Meds 1. AmLODIPine Besylate TABS; Therapy: (Recorded:06Dec2013) to 2. Aspirin 81 MG Oral Tablet; Therapy: (Recorded:16Aug2010) to 3. BD Insulin Syr Ultrafine II 31G X 5/16" 0.5 ML Miscellaneous; Therapy: 08Jan2011 to 4. Carvedilol 6.25 MG Oral Tablet; Therapy: (Recorded:16Aug2010) to 5. Furosemide 80 MG Oral Tablet; Therapy: (Recorded:16Aug2010) to 6. Hydrocodone-Acetaminophen 5-325 MG Oral Tablet; 1 - 2 po q 4-6 hours prn pain; Therapy:  14Mar2014 to (Last Rx:14Mar2014) 7. Insulin; Therapy: (Recorded:16Aug2010) to 8. Losartan Potassium 50 MG Oral Tablet; Therapy: (Recorded:24Mar2014) to 9. NovoLOG Mix 70/30 (70-30) 100 UNIT/ML Subcutaneous Suspension; Therapy: 17Mar2014 to 10. Polymyxin  B-Trimethoprim 10000-0.1 UNIT/ML-% Ophthalmic Solution; Therapy: 03Feb2012 to 11. Pravastatin Sodium 40 MG Oral Tablet; Therapy: 04Sep2010 to 12. Sulfamethoxazole-TMP DS 800-160 MG Oral Tablet; Therapy: 23Mar2014 to 13. Viagra 100 MG Oral Tablet; TAKE 1 TABLET PRN PRN; Therapy: 22Nov2010 to (Last   Rx:22Nov2010) 14. Vitamin D TABS; Therapy: (Recorded:10Mar2014) to  Allergies 1. No Known Drug Allergies  Family History 1. Maternal history of  Death In The Family Father 2. Maternal history of  Death In The Family Mother 3. Family history of  Diabetes Mellitus V18.0 4. Maternal history of  Family Health Status Number Of Children 5. Paternal history of  Hypertension V17.49  Social History 1. Caffeine Use 2-3 soda per day 2. Marital History - Currently Married 3. Never A Smoker 4. Occupation: spinner in [employer]  5. History of  Alcohol Use 6. History of  Tobacco Use   Past and social history reviewed and updated.   Review of Systems Genitourinary, constitutional, skin, eye, otolaryngeal, hematologic/lymphatic, cardiovascular, pulmonary, endocrine, musculoskeletal, gastrointestinal, neurological and psychiatric system(s) were reviewed and pertinent findings if present are noted.  Genitourinary: weak urinary stream.  Cardiovascular: no chest pain.  Respiratory: no shortness of breath.    Vitals Vital Signs [Data Includes: Last 1 Day]  13Jun2014 03:15PM  Blood Pressure: 160 / 71 Temperature: 98.8 F Heart Rate: 78  Physical Exam Constitutional: Well nourished and well developed . No acute distress.  Pulmonary: No respiratory distress and normal respiratory rhythm and effort.  Cardiovascular: Heart rate and rhythm are normal . No peripheral edema.  Abdomen: The abdomen is obese. The abdomen is soft and nontender.  Results/Data  Flow Rate: Voided 115 ml. A peak flow rate of 24ml/s, mean flow rate of 53ml/s and very reduced but sustained. flow curve .  PVR: Ultrasound  PVR 104 ml.    Procedure  Procedure: Cystoscopy   Indication: Lower Urinary Tract Symptoms.  Prep: The patient was prepped with betadine.  Anesthesia:. Local anesthesia was administered intraurethrally with 2% lidocaine jelly.  Procedure Note:  Urethral meatus:. No abnormalities.  Anterior urethra:. A completely obliterating stricture was present (I attempt to pass both a glidewire and teflon wire without success. ) in the bulbar urethra and an attempt was made to dilate the stricture but was unsuccessful.  Prostatic urethra:. Not visualized.  Bladder: Not visualized. After the cystoscopy he was able to void some with a very weak stream and straining to void. The patient tolerated the procedure well.  Complications: None.    Assessment 1. Acute Urinary Retention 788.20 2. Urethral Stricture 598.9   His stricture has closed back down and he has great difficulty voiding.   Plan  I am going to set him up to go to the OR this evening for endoscopy with an attempt at urethral dilation and if that is unsuccessful I will place an SP tube.   Discussion/Summary    CC: Dr. Wyvonnia Lora.

## 2013-01-01 NOTE — Anesthesia Postprocedure Evaluation (Signed)
  Anesthesia Post-op Note  Patient: Seth Werner  Procedure(s) Performed: Procedure(s): CYSTOSCOPY WITH URETHRAL DILATATION (N/A)  Patient Location: PACU  Anesthesia Type:General  Level of Consciousness: awake, alert , oriented and patient cooperative  Airway and Oxygen Therapy: Patient Spontanous Breathing  Post-op Pain: none  Post-op Assessment: Post-op Vital signs reviewed, Patient's Cardiovascular Status Stable, Respiratory Function Stable, Patent Airway, No signs of Nausea or vomiting and Pain level controlled  Post-op Vital Signs: Reviewed and stable  Complications: No apparent anesthesia complications

## 2013-01-01 NOTE — Progress Notes (Signed)
Refuses po fluids. 

## 2013-01-01 NOTE — Brief Op Note (Signed)
01/01/2013  6:16 PM  PATIENT:  Seth Werner  57 y.o. male  PRE-OPERATIVE DIAGNOSIS:  Acute Urinary Retention Urethral Stricture  POST-OPERATIVE DIAGNOSIS: Same  PROCEDURE:  Procedure(s): CYSTOSCOPY WITH URETHRAL DILATATION (N/A)  SURGEON:  Surgeon(s) and Role:    * Anner Crete, MD - Primary  PHYSICIAN ASSISTANT:   ASSISTANTS: none   ANESTHESIA:   general  EBL:  Total I/O In: 50 [I.V.:50] Out: -   BLOOD ADMINISTERED:none  DRAINS: Urinary Catheter (Foley)   LOCAL MEDICATIONS USED:  NONE  SPECIMEN:  No Specimen  DISPOSITION OF SPECIMEN:  N/A  COUNTS:  YES  TOURNIQUET:  * No tourniquets in log *  DICTATION: .Other Dictation: Dictation Number [id number]  PLAN OF CARE: Discharge to home after PACU  PATIENT DISPOSITION:  PACU - hemodynamically stable.   Delay start of Pharmacological VTE agent (>24hrs) due to surgical blood loss or risk of bleeding: not applicable

## 2013-01-01 NOTE — Transfer of Care (Signed)
Immediate Anesthesia Transfer of Care Note  Patient: Seth Werner  Procedure(s) Performed: Procedure(s): CYSTOSCOPY WITH URETHRAL DILATATION (N/A)  Patient Location: PACU  Anesthesia Type:General  Level of Consciousness: awake, alert , oriented and patient cooperative  Airway & Oxygen Therapy: Patient Spontanous Breathing and Patient connected to face mask oxygen  Post-op Assessment: Report given to PACU RN, Post -op Vital signs reviewed and stable and Patient moving all extremities  Post vital signs: Reviewed and stable  Complications: No apparent anesthesia complications

## 2013-01-01 NOTE — Anesthesia Procedure Notes (Signed)
Procedure Name: LMA Insertion Date/Time: 01/01/2013 5:58 PM Performed by: Despina Hidden Pre-anesthesia Checklist: Patient identified, Emergency Drugs available, Suction available and Patient being monitored Patient Re-evaluated:Patient Re-evaluated prior to inductionOxygen Delivery Method: Circle system utilized Preoxygenation: Pre-oxygenation with 100% oxygen Intubation Type: IV induction Ventilation: Mask ventilation without difficulty LMA: LMA inserted LMA Size: 4.0 Grade View: Grade I Tube type: Oral Number of attempts: 1 Placement Confirmation: breath sounds checked- equal and bilateral and positive ETCO2 Tube secured with: Tape Dental Injury: Teeth and Oropharynx as per pre-operative assessment

## 2013-01-02 NOTE — Op Note (Signed)
NAMEGERHARDT, GLEED NO.:  [id number]  MEDICAL RECORD NO.:  [id number]  LOCATION:  ED                            FACILITY:  [facility]  PHYSICIAN:  Alaster Asfaw. Annabell Howells, M.D.    DATE OF BIRTH:  1956/01/29  DATE OF PROCEDURE:  01/01/2013 DATE OF DISCHARGE:  01/01/2013                              OPERATIVE REPORT   PROCEDURE:  Cystoscopy with urethral dilation and placement of Foley catheter.  PREOPERATIVE DIAGNOSIS:  Recurrent membranous urethral stricture with urinary retention.  POSTOPERATIVE DIAGNOSIS:  Recurrent membranous urethral stricture with urinary retention.  SURGEON:  Kyzen Horn. Annabell Howells, M.D.  ANESTHESIA:  General.  SPECIMEN:  None.  DRAINS:  A 16-French Foley catheter.  COMPLICATIONS:  None.  INDICATIONS:  Mr. Ellithorpe is a 57 year old white male with prior seed implantation for prostate cancer, who has developed recurrent membranous urethral stricture.  This required dilation on several occasions. Because of the location of the stricture, laser or visual internal urethrotomy were not felt to be feasible.  Dilation is the best option. The patient was voiding well with a slightly reduced stream until today when he had acute onset of inability urinate.  He was seen in our office where an attempt was made to cannulate the stricture with the wire via cystoscopy; however, this was unsuccessful. However, after the attempted manipulation, he was able to void slightly to provide some comfort.  It was felt urgent dilation of the stricture was indicated to avoid the need for emergency intervention over the weekend.  FINDINGS AND PROCEDURE:  He was given Cipro.  He was taken to the operating room where general anesthetic was induced.  He was placed in lithotomy position.  His perineum and genitalia were prepped with Betadine solution and draped in usual sterile fashion.  Cystoscopy was performed using the 6-French by a rigid ureteral scope to better appose the  area of stricture.  Once the scope was passed, the tight stricture was identified and a Sensor guidewire was passed through the scope into the bladder without difficulty.  At this point, a 10 cm 15-French ureteral balloon dilation catheter, which was largest balloon available was used to dilate the stricture to 18 atmospheres.  The balloon was then removed.  I was able to get the scope through the stricture but not into the bladder due to the stiffness and angulation.  A guidewire was left in place and the cystoscope was removed.  An attempt was made to pass a [employer] sound.  However, I was not comfortable that I was in the true urethra, and after one attempt, this was not continued because of blood on the tip of the sound.  I then converted a 16-French Coude catheter to a Council catheter by cutting the tip.  I was able to slide this over the wire into the bladder successfully.  The balloon was filled with 10 mL sterile fluid and urine returned.  The wire was removed.  The catheter was placed to straight drainage.  The patient was taken down from lithotomy position.  His anesthetic was reversed.  He was moved to recovery room in stable condition.  There were  no complications.     [name]. Annabell Howells, M.D.     JJW/MEDQ  D:  01/01/2013  T:  01/02/2013  Job:  [id number]

## 2013-01-04 LAB — GLUCOSE, CAPILLARY: Glucose-Capillary: 162 mg/dL — ABNORMAL HIGH (ref 70–99)

## 2013-01-05 ENCOUNTER — Encounter (HOSPITAL_COMMUNITY): Payer: Self-pay | Admitting: Urology

## 2013-01-05 LAB — POCT I-STAT, CHEM 8
BUN: 17 mg/dL (ref 6–23)
Creatinine, Ser: 1.8 mg/dL — ABNORMAL HIGH (ref 0.50–1.35)
Potassium: 4 mEq/L (ref 3.5–5.1)
Sodium: 142 mEq/L (ref 135–145)

## 2013-01-08 ENCOUNTER — Ambulatory Visit (INDEPENDENT_AMBULATORY_CARE_PROVIDER_SITE_OTHER): Payer: BC Managed Care – PPO | Admitting: Urology

## 2013-01-08 DIAGNOSIS — N471 Phimosis: Secondary | ICD-10-CM

## 2013-01-08 DIAGNOSIS — N478 Other disorders of prepuce: Secondary | ICD-10-CM

## 2013-01-08 DIAGNOSIS — IMO0002 Reserved for concepts with insufficient information to code with codable children: Secondary | ICD-10-CM

## 2013-01-08 DIAGNOSIS — N476 Balanoposthitis: Secondary | ICD-10-CM

## 2013-01-15 ENCOUNTER — Ambulatory Visit (INDEPENDENT_AMBULATORY_CARE_PROVIDER_SITE_OTHER): Payer: BC Managed Care – PPO | Admitting: Urology

## 2013-01-15 DIAGNOSIS — IMO0002 Reserved for concepts with insufficient information to code with codable children: Secondary | ICD-10-CM

## 2013-05-13 LAB — HEMOGLOBIN A1C: HEMOGLOBIN A1C: 9.5 % — AB (ref 4.0–6.0)

## 2013-06-04 ENCOUNTER — Ambulatory Visit (INDEPENDENT_AMBULATORY_CARE_PROVIDER_SITE_OTHER): Payer: Medicaid Other | Admitting: Urology

## 2013-06-04 ENCOUNTER — Encounter (INDEPENDENT_AMBULATORY_CARE_PROVIDER_SITE_OTHER): Payer: Self-pay

## 2013-06-04 DIAGNOSIS — IMO0002 Reserved for concepts with insufficient information to code with codable children: Secondary | ICD-10-CM

## 2013-06-04 DIAGNOSIS — Z8546 Personal history of malignant neoplasm of prostate: Secondary | ICD-10-CM

## 2013-06-04 DIAGNOSIS — R82998 Other abnormal findings in urine: Secondary | ICD-10-CM

## 2013-08-22 ENCOUNTER — Encounter (HOSPITAL_COMMUNITY): Payer: Self-pay | Admitting: *Deleted

## 2013-08-22 ENCOUNTER — Observation Stay (HOSPITAL_COMMUNITY)
Admission: RE | Admit: 2013-08-22 | Discharge: 2013-08-24 | Disposition: A | Discharge status: Home / Self Care | Payer: Medicaid Other | Source: Other Acute Inpatient Hospital | Attending: Internal Medicine | Admitting: Internal Medicine

## 2013-08-22 DIAGNOSIS — I129 Hypertensive chronic kidney disease with stage 1 through stage 4 chronic kidney disease, or unspecified chronic kidney disease: Secondary | ICD-10-CM | POA: Insufficient documentation

## 2013-08-22 DIAGNOSIS — R079 Chest pain, unspecified: Secondary | ICD-10-CM | POA: Diagnosis present

## 2013-08-22 DIAGNOSIS — I10 Essential (primary) hypertension: Secondary | ICD-10-CM | POA: Diagnosis present

## 2013-08-22 DIAGNOSIS — E1122 Type 2 diabetes mellitus with diabetic chronic kidney disease: Secondary | ICD-10-CM | POA: Diagnosis present

## 2013-08-22 DIAGNOSIS — R9431 Abnormal electrocardiogram [ECG] [EKG]: Secondary | ICD-10-CM | POA: Insufficient documentation

## 2013-08-22 DIAGNOSIS — R0789 Other chest pain: Principal | ICD-10-CM | POA: Insufficient documentation

## 2013-08-22 DIAGNOSIS — E119 Type 2 diabetes mellitus without complications: Secondary | ICD-10-CM

## 2013-08-22 DIAGNOSIS — N183 Chronic kidney disease, stage 3 unspecified: Secondary | ICD-10-CM | POA: Insufficient documentation

## 2013-08-22 HISTORY — DX: Abnormal electrocardiogram (ECG) (EKG): R94.31

## 2013-08-22 HISTORY — DX: Type 2 diabetes mellitus without complications: E11.9

## 2013-08-22 LAB — GLUCOSE, CAPILLARY: Glucose-Capillary: 145 mg/dL — ABNORMAL HIGH (ref 70–99)
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
## 2013-08-23 ENCOUNTER — Observation Stay (HOSPITAL_COMMUNITY): Payer: Medicaid Other

## 2013-08-23 ENCOUNTER — Encounter (HOSPITAL_COMMUNITY): Payer: Self-pay | Admitting: Cardiology

## 2013-08-23 DIAGNOSIS — I10 Essential (primary) hypertension: Secondary | ICD-10-CM | POA: Diagnosis present

## 2013-08-23 DIAGNOSIS — R079 Chest pain, unspecified: Secondary | ICD-10-CM

## 2013-08-23 DIAGNOSIS — N183 Chronic kidney disease, stage 3 unspecified: Secondary | ICD-10-CM | POA: Diagnosis present

## 2013-08-23 DIAGNOSIS — E119 Type 2 diabetes mellitus without complications: Secondary | ICD-10-CM

## 2013-08-23 DIAGNOSIS — R9431 Abnormal electrocardiogram [ECG] [EKG]: Secondary | ICD-10-CM

## 2013-08-23 DIAGNOSIS — E1122 Type 2 diabetes mellitus with diabetic chronic kidney disease: Secondary | ICD-10-CM | POA: Diagnosis present

## 2013-08-23 HISTORY — DX: Type 2 diabetes mellitus without complications: E11.9

## 2013-08-23 HISTORY — DX: Abnormal electrocardiogram (ECG) (EKG): R94.31

## 2013-08-23 LAB — CREATININE, SERUM
Creatinine, Ser: 1.97 mg/dL — ABNORMAL HIGH (ref 0.50–1.35)
GFR calc non Af Amer: 35 mL/min — ABNORMAL LOW (ref >90)
GFR, EST AFRICAN AMERICAN: 41 mL/min — AB (ref >90)

## 2013-08-23 LAB — CBC
HCT: 39 % (ref 39.0–52.0)
Hemoglobin: 13.1 g/dL (ref 13.0–17.0)
MCH: 30.4 pg (ref 26.0–34.0)
MCHC: 33.6 g/dL (ref 30.0–36.0)
MCV: 90.5 fL (ref 78.0–100.0)
Platelets: 195 10*3/uL (ref 150–400)
RBC: 4.31 MIL/uL (ref 4.22–5.81)
RDW: 13.3 % (ref 11.5–15.5)
WBC: 6.2 10*3/uL (ref 4.0–10.5)

## 2013-08-23 LAB — TROPONIN I: Troponin I: <0.3 ng/mL (ref <0.30)

## 2013-08-23 LAB — GLUCOSE, CAPILLARY
GLUCOSE-CAPILLARY: 121 mg/dL — AB (ref 70–99)
GLUCOSE-CAPILLARY: 197 mg/dL — AB (ref 70–99)
GLUCOSE-CAPILLARY: 200 mg/dL — AB (ref 70–99)
Glucose-Capillary: 169 mg/dL — ABNORMAL HIGH (ref 70–99)

## 2013-08-23 MED ORDER — REGADENOSON 0.4 MG/5ML IV SOLN
0.4000 mg | Freq: Once | INTRAVENOUS | Status: AC
  Administered 2013-08-23: 0.4 mg via INTRAVENOUS
  Filled 2013-08-23: qty 5

## 2013-08-23 MED ORDER — ASPIRIN EC 81 MG PO TBEC
81.0000 mg | DELAYED_RELEASE_TABLET | Freq: Every day | ORAL | Status: DC
  Administered 2013-08-23 – 2013-08-24 (×2): 81 mg via ORAL
  Filled 2013-08-23 (×2): qty 1

## 2013-08-23 MED ORDER — GI COCKTAIL ~~LOC~~
30.0000 mL | Freq: Four times a day (QID) | ORAL | Status: DC | PRN
  Administered 2013-08-23: 30 mL via ORAL
  Filled 2013-08-23: qty 30

## 2013-08-23 MED ORDER — INSULIN ASPART 100 UNIT/ML ~~LOC~~ SOLN
0.0000 [IU] | Freq: Three times a day (TID) | SUBCUTANEOUS | Status: DC
  Administered 2013-08-23 – 2013-08-24 (×3): 4 [IU] via SUBCUTANEOUS

## 2013-08-23 MED ORDER — TECHNETIUM TC 99M SESTAMIBI GENERIC - CARDIOLITE
30.0000 | Freq: Once | INTRAVENOUS | Status: AC | PRN
  Administered 2013-08-23: 30 via INTRAVENOUS

## 2013-08-23 MED ORDER — CARVEDILOL 25 MG PO TABS
25.0000 mg | ORAL_TABLET | Freq: Two times a day (BID) | ORAL | Status: DC
  Administered 2013-08-23 – 2013-08-24 (×2): 25 mg via ORAL
  Filled 2013-08-23 (×6): qty 1

## 2013-08-23 MED ORDER — AMLODIPINE BESYLATE 10 MG PO TABS
10.0000 mg | ORAL_TABLET | Freq: Every day | ORAL | Status: DC
  Administered 2013-08-23 – 2013-08-24 (×2): 10 mg via ORAL
  Filled 2013-08-23 (×3): qty 1

## 2013-08-23 MED ORDER — INSULIN GLARGINE 100 UNIT/ML ~~LOC~~ SOLN
20.0000 [IU] | Freq: Every day | SUBCUTANEOUS | Status: DC
  Administered 2013-08-23 (×2): 20 [IU] via SUBCUTANEOUS
  Filled 2013-08-23 (×3): qty 0.2

## 2013-08-23 MED ORDER — HEPARIN SODIUM (PORCINE) 5000 UNIT/ML IJ SOLN
5000.0000 [IU] | Freq: Three times a day (TID) | INTRAMUSCULAR | Status: DC
  Administered 2013-08-23 – 2013-08-24 (×5): 5000 [IU] via SUBCUTANEOUS
  Filled 2013-08-23 (×8): qty 1

## 2013-08-23 MED ORDER — ACETAMINOPHEN 325 MG PO TABS: 650.0000 mg | ORAL_TABLET | ORAL | Status: DC | PRN

## 2013-08-23 MED ORDER — ONDANSETRON HCL 4 MG/2ML IJ SOLN
4.0000 mg | Freq: Four times a day (QID) | INTRAMUSCULAR | Status: DC | PRN
  Administered 2013-08-23: 4 mg via INTRAVENOUS
  Filled 2013-08-23: qty 2

## 2013-08-23 MED ORDER — REGADENOSON 0.4 MG/5ML IV SOLN
INTRAVENOUS | Status: AC
  Administered 2013-08-23: 0.4 mg via INTRAVENOUS
  Filled 2013-08-23: qty 5

## 2013-08-23 MED ORDER — ADULT MULTIVITAMIN W/MINERALS CH
1.0000 | ORAL_TABLET | Freq: Every day | ORAL | Status: DC
  Administered 2013-08-23 – 2013-08-24 (×2): 1 via ORAL
  Filled 2013-08-23 (×2): qty 1

## 2013-08-23 NOTE — Progress Notes (Signed)
       58 yo man with PMH of hypertension, GERD, CKD stage III, urethral stricture who called his PCP today wanted some reflux medication and was told to go to the ER. At [facility] he was found to have lateral ST depressions and Cardiology called at [facility] for transfer. On arrival, he still had some chest burning however the medication he took earlier today (GI cocktail) improved his symptoms. He believes he has GERD. Otherwise, no associated SOB, no real exertional component. No presyncope/syncope   Subjective: no further burning in his chest   Objective: Vital signs in last 24 hours: Temp:  [98 F (36.7 C)-98.4 F (36.9 C)] 98 F (36.7 C) (02/02 0515) Pulse Rate:  [65-70] 70 (02/02 0515) Resp:  [16] 16 (02/02 0515) BP: (118-172)/(79-83) 118/79 mmHg (02/02 0515) SpO2:  [95 %] 95 % (02/02 0515) Weight:  [290 lb (131.543 kg)] 290 lb (131.543 kg) (02/01 2210) Weight change:  Last BM Date: 08/22/13 Intake/Output from previous day: -210 02/01 0701 - 02/02 0700 In: 240 [P.O.:240] Out: 450 [Urine:450] Intake/Output this shift:    PE: General:Pleasant affect, NAD Skin:Warm and dry, brisk capillary refill HEENT:normocephalic, sclera clear, mucus membranes moist Neck:supple, no JVD, no bruits, thick neck  Heart:S1S2 RRR without murmur, gallup, rub or click Lungs:clear without rales, rhonchi, or wheezes TIW:PYKDX, soft, non tender, + BS, do not palpate liver spleen or masses Ext:tr lower ext edema Neuro:alert and oriented, MAE, follows commands, + facial symmetry   Lab Results:  Recent Labs  08/23/13 0416  WBC 6.2  HGB 13.1  HCT 39.0  PLT 195   BMET  Recent Labs  08/23/13 0416  CREATININE 1.97*     Studies/Results: No results found.  Medications: I have reviewed the patient's current medications. Scheduled Meds: . amLODipine  10 mg Oral QAC breakfast  . aspirin EC  81 mg Oral Daily  . carvedilol  25 mg Oral BID WC  . heparin  5,000 Units Subcutaneous Q8H    . insulin aspart  0-24 Units Subcutaneous TID WC  . insulin glargine  20 Units Subcutaneous QHS  . multivitamin with minerals  1 tablet Oral Daily   Continuous Infusions:  PRN Meds:.acetaminophen, gi cocktail, ondansetron (ZOFRAN) IV  Assessment/Plan: Principal Problem:   Chest pain, possible GI but with multiple cardiac risk factors needs eval. Active Problems:   Abnormal EKG, lat ST depression   Diabetes type 2, controlled   HTN (hypertension)   CKD (chronic kidney disease) stage 3, GFR 30-59 ml/min  PLAN:  Troponin Pend. Initial troponin neg.  For nuc study today.  On SSI for DM and Lantus. Body habitus of OSA and pt has been told by anesthesia he had in prior surgery.  + snoring, will need sleep study as outpt. EKG this AM with less ST depression but it does continue.     LOS: 1 day   Time spent with pt. :15 minutes. [facility] R  Nurse Practitioner Certified Pager [phone] or after 5pm and on weekends call [phone] 08/23/2013, 7:46 AM

## 2013-08-23 NOTE — Progress Notes (Signed)
Likely non cardiac given duration and neg Ez Needs outpt sleep study Discharge if myoview neg Has done diabetic teaching and Aic is coming down

## 2013-08-23 NOTE — Progress Notes (Signed)
Lexiscan CL performed. 2-day study, [facility] to read in am.

## 2013-08-23 NOTE — Progress Notes (Signed)
UR completed 

## 2013-08-24 DIAGNOSIS — R9431 Abnormal electrocardiogram [ECG] [EKG]: Secondary | ICD-10-CM

## 2013-08-24 DIAGNOSIS — I10 Essential (primary) hypertension: Secondary | ICD-10-CM

## 2013-08-24 DIAGNOSIS — N183 Chronic kidney disease, stage 3 unspecified: Secondary | ICD-10-CM

## 2013-08-24 DIAGNOSIS — E119 Type 2 diabetes mellitus without complications: Secondary | ICD-10-CM

## 2013-08-24 LAB — GLUCOSE, CAPILLARY
GLUCOSE-CAPILLARY: 168 mg/dL — AB (ref 70–99)
Glucose-Capillary: 165 mg/dL — ABNORMAL HIGH (ref 70–99)

## 2013-08-24 MED ORDER — HYDRALAZINE HCL 10 MG PO TABS
10.0000 mg | ORAL_TABLET | Freq: Two times a day (BID) | ORAL | Status: DC
  Administered 2013-08-24: 10 mg via ORAL
  Filled 2013-08-24: qty 1

## 2013-08-24 MED ORDER — PANTOPRAZOLE SODIUM 40 MG PO TBEC: 40.0000 mg | DELAYED_RELEASE_TABLET | Freq: Every day | ORAL | Status: DC

## 2013-08-24 MED ORDER — PANTOPRAZOLE SODIUM 40 MG PO TBEC
40.0000 mg | DELAYED_RELEASE_TABLET | Freq: Every day | ORAL | Status: DC
  Administered 2013-08-24: 40 mg via ORAL
  Filled 2013-08-24: qty 1

## 2013-08-24 MED ORDER — HYDRALAZINE HCL 10 MG PO TABS: 10.0000 mg | ORAL_TABLET | Freq: Two times a day (BID) | ORAL | Status: DC

## 2013-08-24 MED ORDER — FUROSEMIDE 80 MG PO TABS
80.0000 mg | ORAL_TABLET | Freq: Every day | ORAL | Status: DC
  Administered 2013-08-24: 80 mg via ORAL
  Filled 2013-08-24: qty 1

## 2013-08-24 MED ORDER — TECHNETIUM TC 99M SESTAMIBI GENERIC - CARDIOLITE
30.0000 | Freq: Once | INTRAVENOUS | Status: AC | PRN
  Administered 2013-08-24: 30 via INTRAVENOUS

## 2013-08-24 MED ORDER — HYDRALAZINE HCL 10 MG PO TABS
10.0000 mg | ORAL_TABLET | Freq: Three times a day (TID) | ORAL | Status: DC
  Administered 2013-08-24: 10 mg via ORAL
  Filled 2013-08-24 (×3): qty 1

## 2013-08-24 NOTE — Discharge Summary (Signed)
Physician Discharge Summary  Patient ID: Seth Werner MRN: [id number] DOB/AGE: [birth year] 58 y.o.  Admit date: 08/22/2013 Discharge date: 08/24/2013  Admission Diagnoses: Chest Pain  Discharge Diagnoses:  Principal Problem:   Chest pain, possible GI but with multiple cardiac risk factors  Active Problems:   Abnormal EKG, lat ST depression   Diabetes type 2, controlled   HTN (hypertension)   CKD (chronic kidney disease) stage 3, GFR 30-59 ml/min   Discharged Condition: stable  Hospital Course: The patient is a 58 y/o male with a history of HTN, GERD, stage III CKD and urethral stricture, who initially presented to [facility] ER on 08/22/13 with a complaint of chest pain. The patient felt that it was likely secondary to GERD and initially called his PCP requesting reflux medications. However, he was instructed to go to the ER instead. At [facility], he was found to have lateral ST depressions and was subsequently transferred to [facility] for further evaluation. On arrival to [facility], he continued to have a burning sensation in his chest. Initial troponin was negative. He was given a GI cocktail and his pain improved significantly. However, he was admitted for rule-out. Cardiac enzymes were cycled and were negative x 3. He underwent a 2 day nuclear stress study which showed no scar or ischemia. He had normal LVEF at 63%. There were no wall motion abnormalities. It should be noted that during this admission, he was moderately hypertensive. He was continued on his home dose of Coreg and Amlodipine. Hydralazine BID was also initiated and his BP was better controlled. He was also placed on Protonix for GERD. He was last seen and examined by Dr. Radford Pax, who felt he was stable for discharge home. He will follow-up with Dr. Harl Bowie in [place].   Consults: None  Significant Diagnostic Studies:   NST 2/2- 08/24/13  Treatments: See Hospital Course  Discharge Exam: Blood pressure 151/73, pulse 66, temperature  98 F (36.7 C), temperature source Oral, resp. rate 18, height 5\' 5"  (1.651 m), weight 290 lb (131.543 kg), SpO2 98.00%.   Disposition: 01-Home or Self Care       Future Appointments Provider Department Dept Phone   08/30/2013 1:30 PM Lendon Colonel, NP [facility] Linna Hoff [phone]       Medication List         amLODipine 10 MG tablet  Commonly known as:  NORVASC  Take 10 mg by mouth daily before breakfast.     aspirin EC 81 MG tablet  Take 81 mg by mouth at bedtime.     Canagliflozin 100 MG Tabs  Take 1 tablet by mouth at bedtime.     carvedilol 25 MG tablet  Commonly known as:  COREG  Take 25 mg by mouth 2 (two) times daily with a meal.     furosemide 80 MG tablet  Commonly known as:  LASIX  Take 80 mg by mouth daily before breakfast.     hydrALAZINE 10 MG tablet  Commonly known as:  APRESOLINE  Take 1 tablet (10 mg total) by mouth 2 (two) times daily.     LANTUS SOLOSTAR 100 UNIT/ML Solostar Pen  Generic drug:  Insulin Glargine  Inject 40 Units into the skin at bedtime.     MUCUS RELIEF PO  Take 1 tablet by mouth every 6 (six) hours as needed (pain).     multivitamin with minerals Tabs tablet  Take 1 tablet by mouth daily.     NOVOLOG FLEXPEN 100 UNIT/ML FlexPen  Generic drug:  insulin aspart  Inject 0-7 Units into the skin 3 (three) times daily with meals. Based on sliding scale:  CBG <90 0 units,  90-150 5 units, 150-600 6 units, 200-250 7 units     pantoprazole 40 MG tablet  Commonly known as:  PROTONIX  Take 1 tablet (40 mg total) by mouth daily at 6 (six) AM.     pravastatin 40 MG tablet  Commonly known as:  PRAVACHOL  Take 40 mg by mouth at bedtime.     Vitamin D (Ergocalciferol) 50000 UNITS Caps capsule  Commonly known as:  DRISDOL  Take 50,000 Units by mouth every 30 (thirty) days. On the 1st of each month       Follow-up Information   Follow up with Jory Sims, NP On 08/30/2013. (1:30 pm )    Specialty:  Nurse  Practitioner   Contact information:   [place] [id number] [place], INCLUDING PHYSICIAN TIME: > 30 MINUTES  Signed: SIMMONS, BRITTAINY 08/24/2013, 12:01 PM

## 2013-08-24 NOTE — Progress Notes (Signed)
Advanced directives packet completed per patient request and is on chart.  AVS reviewed with patient and he verbalizes understanding.

## 2013-08-24 NOTE — Progress Notes (Signed)
Chaplain assisted patient with the completion of his Advance Directive, confirming that patient was oriented and understood the documents and his decisions. Chaplain assisted with notarization process with Danella Deis from [place]. Patient was grateful for chaplain support and AD service.   [facility]: [phone]

## 2013-08-24 NOTE — Progress Notes (Addendum)
  SUBJECTIVE:  No compliants this am - had stress portion of stress test yesterday  OBJECTIVE:   Vitals:   Filed Vitals:   08/23/13 1018 08/23/13 1453 08/23/13 2154 08/24/13 0558  BP: 136/76 148/71 183/89 195/70  Pulse: 80 64 68 66  Temp:  98.5 F (36.9 C) 98.1 F (36.7 C) 98 F (36.7 C)  TempSrc:  Oral Oral Oral  Resp:  18 18   Height:      Weight:      SpO2:  98% 96% 98%   I&O's:   Intake/Output Summary (Last 24 hours) at 08/24/13 9450 Last data filed at 08/23/13 2230  Gross per 24 hour  Intake    240 ml  Output    350 ml  Net   -110 ml   TELEMETRY: Reviewed telemetry pt in NS[facility]:     PHYSICAL EXAM General: Well developed, well nourished, in no acute distress Head: Eyes PERRLA, No xanthomas.   Normal cephalic and atramatic  Lungs:   Clear bilaterally to auscultation and percussion. Heart:   HRRR S1 S2 Pulses are 2+ & equal. Abdomen: Bowel sounds are positive, abdomen soft and non-tender without masses  Extremities:   No clubbing, cyanosis or edema.  DP +1 Neuro: Alert and oriented X 3. Psych:  Good affect, responds appropriately   LABS: Basic Metabolic Panel:  Recent Labs  08/23/13 0416  CREATININE 1.97*   Liver Function Tests: No results found for this basename: AST, ALT, ALKPHOS, BILITOT, PROT, ALBUMIN,  in the last 72 hours No results found for this basename: LIPASE, AMYLASE,  in the last 72 hours CBC:  Recent Labs  08/23/13 0416  WBC 6.2  HGB 13.1  HCT 39.0  MCV 90.5  PLT 195   Cardiac Enzymes:  Recent Labs  08/23/13 0755 08/23/13 1135 08/23/13 1836  TROPONINI <0.30 <0.30 <0.30     RADIOLOGY: No results found.  Assessment/Plan:  Principal Problem:  Chest pain, possible GI but with multiple cardiac risk factors needs eval.  Active Problems:  Abnormal EKG, lat ST depression  Diabetes type 2, controlled  HTN (hypertension) - still poorly controlled despite amlodipine and carvedilol.  No ACE I secondary to CKD CKD (chronic kidney  disease) stage 3, GFR 30-59 ml/min   PLAN:  1.  Stress images completed yesterday and rest images have just been completed.  If study shows no ischemia then will d/c home 2.  Body habitus of OSA and pt has been told by anesthesia he had in prior surgery. + snoring, will set up outpatient sleep study  3. Continue Amlodipine and Coreg. 4. Add Hydralazine 10mg  BID 5. Followup with Dr. Harl Bowie in 1 week for BP check 6. Continue home dose of Lasix  7. Discussed with the patient the importance of following a strict low sodium diet 8.  Add Protonix 40mg  daily since he had a lot of reflux and belching on presentation      Sueanne Margarita, MD  08/24/2013  9:21 AM

## 2013-08-30 ENCOUNTER — Encounter: Payer: Self-pay | Admitting: Adult Health

## 2013-08-30 ENCOUNTER — Ambulatory Visit (INDEPENDENT_AMBULATORY_CARE_PROVIDER_SITE_OTHER): Payer: Medicaid Other | Admitting: Adult Health

## 2013-08-30 VITALS — BP 130/74 | HR 69 | Ht 65.0 in | Wt 293.0 lb

## 2013-08-30 DIAGNOSIS — R079 Chest pain, unspecified: Secondary | ICD-10-CM

## 2013-08-30 DIAGNOSIS — K219 Gastro-esophageal reflux disease without esophagitis: Secondary | ICD-10-CM

## 2013-08-30 DIAGNOSIS — I10 Essential (primary) hypertension: Secondary | ICD-10-CM

## 2013-08-30 NOTE — Progress Notes (Deleted)
Name: Seth Werner    DOB: Dec 15, 1955  Age: 58 y.o.  MR#: [id number]       PCP:  Deloria Lair, MD      Insurance: Payor: MEDICAID [place] / Plan: MEDICAID OF [place] / Product Type: *No Product type* /   CC:    Chief Complaint  Patient presents with  . Chest Pain  . Hypertension    VS Filed Vitals:   08/30/13 1339  BP: 130/74  Pulse: 69  Height: 5\' 5"  (1.651 m)  Weight: 293 lb (132.904 kg)    Weights Current Weight  08/30/13 293 lb (132.904 kg)  08/22/13 290 lb (131.543 kg)  01/01/13 285 lb (129.275 kg)    Blood Pressure  BP Readings from Last 3 Encounters:  08/30/13 130/74  08/24/13 151/73  01/01/13 161/65     Admit date:  (Not on file) Last encounter with RMR:  Visit date not found   Allergy Review of patient's allergies indicates no known allergies.  Current Outpatient Prescriptions  Medication Sig Dispense Refill  . amLODipine (NORVASC) 10 MG tablet Take 10 mg by mouth daily before breakfast.      . aspirin EC 81 MG tablet Take 81 mg by mouth at bedtime.       . Canagliflozin 100 MG TABS Take 1 tablet by mouth at bedtime.      . carvedilol (COREG) 25 MG tablet Take 25 mg by mouth 2 (two) times daily with a meal.      . furosemide (LASIX) 80 MG tablet Take 80 mg by mouth daily before breakfast.      . GuaiFENesin (MUCUS RELIEF PO) Take 1 tablet by mouth every 6 (six) hours as needed (pain).      . hydrALAZINE (APRESOLINE) 10 MG tablet Take 1 tablet (10 mg total) by mouth 2 (two) times daily.  60 tablet  5  . insulin aspart (NOVOLOG FLEXPEN) 100 UNIT/ML FlexPen Inject 0-7 Units into the skin 3 (three) times daily with meals. Based on sliding scale:  CBG <90 0 units,  90-150 5 units, 150-600 6 units, 200-250 7 units      . Insulin Glargine (LANTUS SOLOSTAR) 100 UNIT/ML Solostar Pen Inject 40 Units into the skin at bedtime.      . Multiple Vitamin (MULTIVITAMIN WITH MINERALS) TABS Take 1 tablet by mouth daily.      . pantoprazole (PROTONIX) 40 MG tablet Take 1 tablet (40 mg  total) by mouth daily at 6 (six) AM.  30 tablet  5  . pravastatin (PRAVACHOL) 40 MG tablet Take 40 mg by mouth at bedtime.       . Vitamin D, Ergocalciferol, (DRISDOL) 50000 UNITS CAPS capsule Take 50,000 Units by mouth every 30 (thirty) days. On the 1st of each month       No current facility-administered medications for this visit.   Facility-Administered Medications Ordered in Other Visits  Medication Dose Route Frequency Provider Last Rate Last Dose  . 0.9 %  sodium chloride infusion  250 mL Intravenous PRN Irine Seal, MD      . acetaminophen (TYLENOL) tablet 650 mg  650 mg Oral Q4H PRN Irine Seal, MD       Or  . acetaminophen (TYLENOL) suppository 650 mg  650 mg Rectal Q4H PRN Irine Seal, MD      . ciprofloxacin (CIPRO) IVPB 400 mg  400 mg Intravenous 60 min Pre-Op Irine Seal, MD      . fentaNYL (SUBLIMAZE) injection 25-50 mcg  25-50  mcg Intravenous Q2H PRN Irine Seal, MD      . oxyCODONE (Oxy IR/ROXICODONE) immediate release tablet 5-10 mg  5-10 mg Oral Q4H PRN Irine Seal, MD      . sodium chloride 0.9 % injection 3 mL  3 mL Intravenous Q12H Irine Seal, MD      . sodium chloride 0.9 % injection 3 mL  3 mL Intravenous PRN Irine Seal, MD        Discontinued Meds:   There are no discontinued medications.  Patient Active Problem List   Diagnosis Date Noted  . Abnormal EKG, lat ST depression 08/23/2013  . Diabetes type 2, controlled 08/23/2013  . HTN (hypertension) 08/23/2013  . CKD (chronic kidney disease) stage 3, GFR 30-59 ml/min 08/23/2013  . Chest pain, possible GI but with multiple cardiac risk factors  08/22/2013    LABS    Component Value Date/Time   NA 142 01/01/2013 1721   NA 141 07/10/2012 1230   K 4.0 01/01/2013 1721   K 3.9 07/10/2012 1230   CL 107 01/01/2013 1721   CL 106 07/10/2012 1230   CO2 27 07/10/2012 1230   GLUCOSE 139* 01/01/2013 1721   GLUCOSE 64* 07/10/2012 1230   BUN 17 01/01/2013 1721   BUN 18 07/10/2012 1230   CREATININE 1.97* 08/23/2013 0416    CREATININE 1.80* 01/01/2013 1721   CREATININE 1.59* 07/10/2012 1230   CALCIUM 9.1 07/10/2012 1230   GFRNONAA 35* 08/23/2013 0416   GFRNONAA 46* 07/10/2012 1230   GFRAA 41* 08/23/2013 0416   GFRAA 54* 07/10/2012 1230   CMP     Component Value Date/Time   NA 142 01/01/2013 1721   K 4.0 01/01/2013 1721   CL 107 01/01/2013 1721   CO2 27 07/10/2012 1230   GLUCOSE 139* 01/01/2013 1721   BUN 17 01/01/2013 1721   CREATININE 1.97* 08/23/2013 0416   CALCIUM 9.1 07/10/2012 1230   GFRNONAA 35* 08/23/2013 0416   GFRAA 41* 08/23/2013 0416       Component Value Date/Time   WBC 6.2 08/23/2013 0416   WBC 4.9 07/10/2012 1240   HGB 13.1 08/23/2013 0416   HGB 13.6 01/01/2013 1721   HGB 12.9* 07/10/2012 1240   HCT 39.0 08/23/2013 0416   HCT 40.0 01/01/2013 1721   HCT 38.4* 07/10/2012 1240   MCV 90.5 08/23/2013 0416   MCV 89.3 07/10/2012 1240    Lipid Panel  No results found for this basename: chol, trig, hdl, cholhdl, vldl, ldlcalc    ABG    Component Value Date/Time   TCO2 26 01/01/2013 1721     No results found for this basename: TSH   BNP (last 3 results) No results found for this basename: PROBNP,  in the last 8760 hours Cardiac Panel (last 3 results) No results found for this basename: CKTOTAL, CKMB, TROPONINI, RELINDX,  in the last 72 hours  Iron/TIBC/Ferritin No results found for this basename: iron, tibc, ferritin     EKG Orders placed during the hospital encounter of 08/22/13  . EKG 12-LEAD  . EKG 12-LEAD  . EKG 12-LEAD  . EKG 12-LEAD  . EXERCISE TOLERANCE TEST  . EXERCISE TOLERANCE TEST  . EKG     Prior Assessment and Plan Problem List as of 08/30/2013     Cardiovascular and Mediastinum   HTN (hypertension)     Endocrine   Diabetes type 2, controlled     Genitourinary   CKD (chronic kidney disease) stage 3, GFR 30-59 ml/min  Other   Chest pain, possible GI but with multiple cardiac risk factors    Abnormal EKG, lat ST depression       Imaging: Nm Myocar Multi W/spect  W/wall Motion / Ef  08/24/2013   CLINICAL DATA:  Chest pain  EXAM: Lexiscan Myovue  TECHNIQUE: The patient received IV Lexiscan 0.4mg  over 15 seconds. 33.0 mCi of Technetium 47m Sestamibi injected at 30 seconds. Quantitative SPECT images were obtained in the vertical, horizontal and short axis planes after a 45 minute delay. Rest images were obtained with similar planes and delay using 10.2 mCi of Technetium 57m Sestamibi.  FINDINGS: ECG:  SR, negative T waves in inferolateral leads  Symptoms:  Consistent with Lexiscan injection.  No chest pain.  RAW Data: Minimal motion, some extracardiac uptake not interfering with study interpretation.  Quantitiative Gated SPECT EF: 63%  Perfusion Images:  No perfusion defect at rest and stress.  IMPRESSION: 1.  Normal study, no scar or ischemia.  2.  Normal LVEF 63%, no wall motion abnormalities.  Ena Dawley   Electronically Signed   By: Ena Dawley   On: 08/24/2013 11:39

## 2013-08-30 NOTE — Assessment & Plan Note (Signed)
I have reviewed his medications. I have explained each antihypertensive and labeled each medication for him, as to its use. He is to stay on current medication as directed. He will follow up in [place] as directed in 6 months.
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
## 2013-08-30 NOTE — Assessment & Plan Note (Signed)
Stress test and echocardiogram were found to be normal. He will not be planned for any further testing from cardiac perspective.  He is referred to Dr. Britta Mccreedy, GI specialist in [facility] for evaluation of GERD and other symptoms.

## 2013-08-30 NOTE — Patient Instructions (Signed)
Your physician recommends that you schedule a follow-up appointment in: 6 months with Dr Harl Bowie in the [place] office You will receive a reminder letter two months in advance reminding you to call and schedule your appointment. If you don't receive this letter, please contact our office.  You have been referred to Dr Britta Mccreedy in [place], [place]  Please use over the counter Gas X as needed

## 2013-12-03 ENCOUNTER — Ambulatory Visit (INDEPENDENT_AMBULATORY_CARE_PROVIDER_SITE_OTHER): Payer: BC Managed Care – PPO | Admitting: Urology

## 2013-12-03 DIAGNOSIS — IMO0002 Reserved for concepts with insufficient information to code with codable children: Secondary | ICD-10-CM

## 2013-12-03 DIAGNOSIS — C61 Malignant neoplasm of prostate: Secondary | ICD-10-CM

## 2014-02-22 ENCOUNTER — Other Ambulatory Visit (HOSPITAL_COMMUNITY): Payer: Self-pay | Admitting: Cardiology

## 2014-02-22 ENCOUNTER — Other Ambulatory Visit: Payer: Self-pay | Admitting: *Deleted

## 2014-03-10 ENCOUNTER — Encounter: Payer: Self-pay | Admitting: Cardiology

## 2014-03-10 ENCOUNTER — Ambulatory Visit (INDEPENDENT_AMBULATORY_CARE_PROVIDER_SITE_OTHER): Payer: BC Managed Care – PPO | Admitting: Cardiology

## 2014-03-10 VITALS — BP 145/83 | HR 70 | Ht 65.0 in | Wt 290.0 lb

## 2014-03-10 DIAGNOSIS — I10 Essential (primary) hypertension: Secondary | ICD-10-CM

## 2014-03-10 DIAGNOSIS — R400 Somnolence: Secondary | ICD-10-CM

## 2014-03-10 DIAGNOSIS — R404 Transient alteration of awareness: Secondary | ICD-10-CM

## 2014-03-10 DIAGNOSIS — R0789 Other chest pain: Secondary | ICD-10-CM

## 2014-03-10 MED ORDER — ATORVASTATIN CALCIUM 40 MG PO TABS: 40.0000 mg | ORAL_TABLET | Freq: Every day | ORAL | Status: DC
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
## 2014-03-10 NOTE — Patient Instructions (Signed)
   Referral to Dr. Redmond Pulling   Stop Pravastain  Change to Lipitor 40mg  daily - new sent to pharm Continue all other medications.   Your physician wants you to follow up in:  1 year.  You will receive a reminder letter in the mail one-two months in advance.  If you don't receive a letter, please call our office to schedule the follow up appointment

## 2014-04-19 ENCOUNTER — Other Ambulatory Visit: Payer: Self-pay | Admitting: *Deleted

## 2014-04-19 MED ORDER — HYDRALAZINE HCL 10 MG PO TABS: 10.0000 mg | ORAL_TABLET | Freq: Two times a day (BID) | ORAL | Status: DC

## 2014-10-07 ENCOUNTER — Other Ambulatory Visit: Payer: Self-pay | Admitting: *Deleted

## 2014-10-07 MED ORDER — ATORVASTATIN CALCIUM 40 MG PO TABS
40.0000 mg | ORAL_TABLET | Freq: Every day | ORAL | Status: DC
Start: 2014-10-07 — End: 2015-05-02

## 2015-02-17 ENCOUNTER — Ambulatory Visit (INDEPENDENT_AMBULATORY_CARE_PROVIDER_SITE_OTHER): Payer: 59 | Admitting: Urology

## 2015-02-17 DIAGNOSIS — R3912 Poor urinary stream: Secondary | ICD-10-CM

## 2015-02-17 DIAGNOSIS — K621 Rectal polyp: Secondary | ICD-10-CM

## 2015-02-17 DIAGNOSIS — C61 Malignant neoplasm of prostate: Secondary | ICD-10-CM | POA: Diagnosis not present

## 2015-02-17 DIAGNOSIS — N359 Urethral stricture, unspecified: Secondary | ICD-10-CM

## 2015-02-17 DIAGNOSIS — Z87438 Personal history of other diseases of male genital organs: Secondary | ICD-10-CM

## 2015-03-08 ENCOUNTER — Encounter: Payer: Self-pay | Admitting: *Deleted

## 2015-03-14 ENCOUNTER — Encounter: Payer: Self-pay | Admitting: Cardiology

## 2015-03-14 ENCOUNTER — Encounter: Payer: Self-pay | Admitting: *Deleted

## 2015-03-14 ENCOUNTER — Ambulatory Visit (INDEPENDENT_AMBULATORY_CARE_PROVIDER_SITE_OTHER): Payer: 59 | Admitting: Cardiology

## 2015-03-14 VITALS — BP 143/79 | HR 75 | Ht 65.0 in | Wt 280.0 lb

## 2015-03-14 DIAGNOSIS — I10 Essential (primary) hypertension: Secondary | ICD-10-CM

## 2015-03-14 DIAGNOSIS — R0789 Other chest pain: Secondary | ICD-10-CM

## 2015-03-14 DIAGNOSIS — E785 Hyperlipidemia, unspecified: Secondary | ICD-10-CM | POA: Diagnosis not present

## 2015-03-14 NOTE — Patient Instructions (Signed)
Your physician wants you to follow-up in: [place] DR. BRANCH You will receive a reminder letter in the mail two months in advance. If you don't receive a letter, please call our office to schedule the follow-up appointment.  Your physician recommends that you continue on your current medications as directed. Please refer to the Current Medication list given to you today.  Your physician has requested that you regularly monitor and record your blood pressure readings at home FOR 2 WEEKS. Please use the same machine at the same time of day to check your readings and record them to bring to your follow-up visit.  WE WILL REQUEST LAB RESULTS  Thank you for choosing [facility]!!
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
## 2015-04-24 ENCOUNTER — Other Ambulatory Visit: Payer: Self-pay | Admitting: *Deleted

## 2015-04-24 MED ORDER — HYDRALAZINE HCL 10 MG PO TABS: 10.0000 mg | ORAL_TABLET | Freq: Two times a day (BID) | ORAL | Status: DC

## 2015-04-27 ENCOUNTER — Telehealth: Payer: Self-pay | Admitting: *Deleted

## 2015-04-27 NOTE — Telephone Encounter (Signed)
-----   Message from Arnoldo Lenis, MD sent at 04/27/2015  2:30 PM EDT ----- BP log looks ok, no changes  Zandra Abts MD

## 2015-04-27 NOTE — Telephone Encounter (Signed)
Pt aware.

## 2015-05-02 ENCOUNTER — Other Ambulatory Visit: Payer: Self-pay | Admitting: *Deleted

## 2015-05-02 MED ORDER — ATORVASTATIN CALCIUM 40 MG PO TABS: 40.0000 mg | ORAL_TABLET | Freq: Every day | ORAL | Status: DC

## 2015-05-09 LAB — LIPID PANEL: LDL CALC: 50 mg/dL

## 2015-05-09 LAB — HEMOGLOBIN A1C: HEMOGLOBIN A1C: 8.2 % — AB (ref 4.0–6.0)

## 2015-05-17 ENCOUNTER — Ambulatory Visit (INDEPENDENT_AMBULATORY_CARE_PROVIDER_SITE_OTHER): Payer: 59 | Admitting: "Endocrinology

## 2015-05-17 ENCOUNTER — Encounter: Payer: Self-pay | Admitting: "Endocrinology

## 2015-05-17 VITALS — BP 131/74 | HR 79 | Ht 65.0 in | Wt 275.0 lb

## 2015-05-17 DIAGNOSIS — I10 Essential (primary) hypertension: Secondary | ICD-10-CM

## 2015-05-17 DIAGNOSIS — N183 Chronic kidney disease, stage 3 unspecified: Secondary | ICD-10-CM

## 2015-05-17 DIAGNOSIS — E785 Hyperlipidemia, unspecified: Secondary | ICD-10-CM

## 2015-05-17 DIAGNOSIS — E1122 Type 2 diabetes mellitus with diabetic chronic kidney disease: Secondary | ICD-10-CM | POA: Diagnosis not present

## 2015-05-17 DIAGNOSIS — E782 Mixed hyperlipidemia: Secondary | ICD-10-CM | POA: Insufficient documentation

## 2015-05-17 MED ORDER — TANZEUM 50 MG ~~LOC~~ PEN: 50.0000 mg | PEN_INJECTOR | SUBCUTANEOUS | Status: DC

## 2015-05-17 MED ORDER — LEVEMIR FLEXTOUCH 100 UNIT/ML ~~LOC~~ SOPN: 70.0000 [IU] | PEN_INJECTOR | Freq: Every day | SUBCUTANEOUS | Status: DC

## 2015-05-17 MED ORDER — LINAGLIPTIN 5 MG PO TABS: 5.0000 mg | ORAL_TABLET | Freq: Every day | ORAL | Status: DC

## 2015-05-17 NOTE — Patient Instructions (Signed)
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
## 2015-05-26 ENCOUNTER — Telehealth: Payer: Self-pay

## 2015-05-26 NOTE — Telephone Encounter (Signed)
Tanzeum & Tradjenta are too expensive for pt. He is requesting something cheaper. States he can no longer afford these.

## 2015-05-27 NOTE — Telephone Encounter (Signed)
He can stop both, continue on insulin , will discuss next visit.

## 2015-05-30 NOTE — Telephone Encounter (Signed)
Pt notified and agrees. 

## 2015-08-15 ENCOUNTER — Other Ambulatory Visit: Payer: Self-pay | Admitting: "Endocrinology

## 2015-08-21 ENCOUNTER — Ambulatory Visit: Payer: 59 | Admitting: "Endocrinology

## 2015-08-25 ENCOUNTER — Other Ambulatory Visit: Payer: Self-pay | Admitting: "Endocrinology

## 2015-08-25 DIAGNOSIS — E1122 Type 2 diabetes mellitus with diabetic chronic kidney disease: Secondary | ICD-10-CM | POA: Diagnosis not present

## 2015-08-25 DIAGNOSIS — N183 Chronic kidney disease, stage 3 (moderate): Secondary | ICD-10-CM | POA: Diagnosis not present

## 2015-08-26 LAB — BASIC METABOLIC PANEL
BUN: 18 mg/dL (ref 7–25)
CALCIUM: 8.8 mg/dL (ref 8.6–10.3)
CHLORIDE: 103 mmol/L (ref 98–110)
CO2: 30 mmol/L (ref 20–31)
CREATININE: 1.8 mg/dL — AB (ref 0.70–1.25)
Glucose, Bld: 180 mg/dL — ABNORMAL HIGH (ref 65–99)
Potassium: 4.2 mmol/L (ref 3.5–5.3)
Sodium: 141 mmol/L (ref 135–146)

## 2015-08-26 LAB — HEMOGLOBIN A1C
Hgb A1c MFr Bld: 10.5 % — ABNORMAL HIGH (ref <5.7)
Mean Plasma Glucose: 255 mg/dL — ABNORMAL HIGH (ref <117)

## 2015-09-04 ENCOUNTER — Ambulatory Visit (INDEPENDENT_AMBULATORY_CARE_PROVIDER_SITE_OTHER): Payer: PPO | Admitting: "Endocrinology

## 2015-09-04 ENCOUNTER — Encounter: Payer: Self-pay | Admitting: "Endocrinology

## 2015-09-04 ENCOUNTER — Telehealth: Payer: Self-pay

## 2015-09-04 VITALS — BP 127/75 | HR 69 | Ht 66.0 in | Wt 281.0 lb

## 2015-09-04 DIAGNOSIS — I10 Essential (primary) hypertension: Secondary | ICD-10-CM

## 2015-09-04 DIAGNOSIS — E1122 Type 2 diabetes mellitus with diabetic chronic kidney disease: Secondary | ICD-10-CM

## 2015-09-04 DIAGNOSIS — E785 Hyperlipidemia, unspecified: Secondary | ICD-10-CM | POA: Diagnosis not present

## 2015-09-04 DIAGNOSIS — N183 Chronic kidney disease, stage 3 unspecified: Secondary | ICD-10-CM

## 2015-09-04 MED ORDER — INSULIN DETEMIR 100 UNIT/ML FLEXPEN: PEN_INJECTOR | SUBCUTANEOUS | Status: DC

## 2015-09-04 MED ORDER — INSULIN ASPART 100 UNIT/ML FLEXPEN: 10.0000 [IU] | PEN_INJECTOR | Freq: Three times a day (TID) | SUBCUTANEOUS | Status: DC

## 2015-09-04 NOTE — Telephone Encounter (Signed)
error 

## 2015-09-04 NOTE — Patient Instructions (Signed)
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
## 2015-09-20 DIAGNOSIS — H4311 Vitreous hemorrhage, right eye: Secondary | ICD-10-CM | POA: Diagnosis not present

## 2015-09-22 ENCOUNTER — Telehealth: Payer: Self-pay | Admitting: Cardiology

## 2015-09-22 DIAGNOSIS — G4733 Obstructive sleep apnea (adult) (pediatric): Secondary | ICD-10-CM

## 2015-09-22 DIAGNOSIS — H4311 Vitreous hemorrhage, right eye: Secondary | ICD-10-CM | POA: Diagnosis not present

## 2015-09-22 DIAGNOSIS — E113593 Type 2 diabetes mellitus with proliferative diabetic retinopathy without macular edema, bilateral: Secondary | ICD-10-CM | POA: Diagnosis not present

## 2015-09-22 NOTE — Telephone Encounter (Signed)
Mr. Gallentine needs to have a new referral to ENT.Marland Kitchen He was on a CPAP machine his insurance refused to continue paying for it.  He is going to have to have retinal surgery on April 6th.  The Ophthalmologist told Mr. Erben that he has to go back on the machine. He now has [employer]. Mr. Micucci is requesting to see if Dr. Harl Bowie will do another referral to ENT.Marland Kitchen He was seeing Dr. Redmond Pulling in [place].

## 2015-09-25 ENCOUNTER — Other Ambulatory Visit: Payer: Self-pay | Admitting: "Endocrinology

## 2015-09-27 ENCOUNTER — Telehealth: Payer: Self-pay | Admitting: Cardiology

## 2015-09-27 NOTE — Telephone Encounter (Signed)
New referral placed.

## 2015-09-27 NOTE — Telephone Encounter (Signed)
Faxed over referral to Dr. Luan Pulling for evaluation of sleep apnea.  Patient was notified of referral being sent.

## 2015-09-28 ENCOUNTER — Telehealth: Payer: Self-pay | Admitting: Cardiology

## 2015-09-28 NOTE — Telephone Encounter (Signed)
Appointment has been scheduled with Dr. Luan Pulling on 11/06/2015 @ 11:30am Called and left message asking patient to call the office back.

## 2015-09-29 ENCOUNTER — Other Ambulatory Visit: Payer: Self-pay

## 2015-09-29 MED ORDER — LANCETS THIN MISC
Status: DC
Start: 2015-09-29 — End: 2016-03-26

## 2015-10-05 ENCOUNTER — Ambulatory Visit (INDEPENDENT_AMBULATORY_CARE_PROVIDER_SITE_OTHER): Payer: PPO | Admitting: "Endocrinology

## 2015-10-05 ENCOUNTER — Encounter: Payer: Self-pay | Admitting: Nutrition

## 2015-10-05 ENCOUNTER — Encounter: Payer: Self-pay | Admitting: "Endocrinology

## 2015-10-05 ENCOUNTER — Encounter: Payer: PPO | Attending: "Endocrinology | Admitting: Nutrition

## 2015-10-05 VITALS — Ht 65.0 in | Wt 282.0 lb

## 2015-10-05 VITALS — BP 128/72 | HR 68 | Ht 65.0 in | Wt 282.0 lb

## 2015-10-05 DIAGNOSIS — E1122 Type 2 diabetes mellitus with diabetic chronic kidney disease: Secondary | ICD-10-CM

## 2015-10-05 DIAGNOSIS — E1165 Type 2 diabetes mellitus with hyperglycemia: Secondary | ICD-10-CM

## 2015-10-05 DIAGNOSIS — E118 Type 2 diabetes mellitus with unspecified complications: Secondary | ICD-10-CM

## 2015-10-05 DIAGNOSIS — N183 Chronic kidney disease, stage 3 unspecified: Secondary | ICD-10-CM

## 2015-10-05 DIAGNOSIS — E785 Hyperlipidemia, unspecified: Secondary | ICD-10-CM | POA: Diagnosis not present

## 2015-10-05 DIAGNOSIS — I10 Essential (primary) hypertension: Secondary | ICD-10-CM | POA: Diagnosis not present
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
## 2015-10-05 NOTE — Patient Instructions (Signed)

## 2015-10-05 NOTE — Progress Notes (Signed)
  Medical Nutrition Therapy:  Appt start time: 0930 end time:  1030.  Assessment:  Primary concerns today:  Diabetes Type 2.. Lives with his wife and 2 sons. His wife and he shop and cook.  Most foods are fried and grilled. Eats three meals per day. Levemir 70  Units and Humalog 10 units with meals plus sliding scale. Gained 7 lbs in the last few months.. Had run out of medications and that caused BS to go up. Usual A1C is about 8% he notes. DIet is excessive in carbs, fat, and low in fresh fruits and low carb vegetables. Needs to cut out sodas and only drink water. Not exercising.  Preferred Learning Style:  No preference indicated   Learning Readiness:   Ready  Change in progress   MEDICATIONS: See list   DIETARY INTAKE:   24-hr recall:  B ( AM):  Cereal or poached egg with toast: This am only dry toast- 2 slices and water Snk ( AM): usually not:  Or sometimes fruit  L ( PM): Hamburger with mayo, water Snk ( PM): none D ( PM):  Chicken fillet(2) on bun  With lettuce, tomato and onions, water Snk ( PM): none Beverages: water, soda occasionally-twice a week.  Usual physical activity:  Walks; 1 hr 5 days per week.   Estimated energy needs: 1800 calories 200 g carbohydrates 135 g protein 50 g fat  Progress Towards Goal(s):  In progress.   Nutritional Diagnosis:  NB-1.1 Food and nutrition-related knowledge deficit As related to Diabetes.  As evidenced by A1C 10,5%.    Intervention:  Nutrition and Diabetes education provided on My Plate, CHO counting, meal planning, portion sizes, timing of meals, avoiding snacks between meals unless having a low blood sugar, target ranges for A1C and blood sugars, signs/symptoms and treatment of hyper/hypoglycemia, monitoring blood sugars, taking medications as prescribed, benefits of exercising 30 minutes per day and prevention of complications of DM.  Goals 1. Follow the Plate Method 2. Cut out snacks between meals 3. Eat 3 balanced  meals with 3-4 carb choices per meal. 4. Eat meals on time. 5. Drink only water and cut out sodas, diet sodas and other sugar free liquids. 6. Exercise 30 minutes three times per week. 7. Lose 1-2 lbs per week. 8. Get A1C down to 7.5% in three months.   Teaching Method Utilized:  Visual Auditory Hands on  Handouts given during visit include:  The Plate Method   Meal Plan Card  Diabetes Instructions.   Barriers to learning/adherence to lifestyle change: None  Demonstrated degree of understanding via:  Teach Back   Monitoring/Evaluation:  Dietary intake, exercise, meal planning, SBG, and body weight in 1 month(s).

## 2015-10-16 NOTE — Patient Instructions (Signed)
Goals 1. Follow the Plate Method 2. Cut out snacks between meals 3. Eat 3 balanced meals with 3-4 carb choices per meal. 4. Eat meals on time. 5. Drink only water and cut out sodas, diet sodas and other sugar free liquids. 6. Exercise 30 minutes three times per week. 7. Lose 1-2 lbs per week. 8. Get A1C down to 7.5% in three months.

## 2015-10-26 DIAGNOSIS — E113593 Type 2 diabetes mellitus with proliferative diabetic retinopathy without macular edema, bilateral: Secondary | ICD-10-CM | POA: Diagnosis not present

## 2015-10-26 DIAGNOSIS — H33301 Unspecified retinal break, right eye: Secondary | ICD-10-CM | POA: Diagnosis not present

## 2015-10-26 DIAGNOSIS — E113591 Type 2 diabetes mellitus with proliferative diabetic retinopathy without macular edema, right eye: Secondary | ICD-10-CM | POA: Diagnosis not present

## 2015-10-26 DIAGNOSIS — H4311 Vitreous hemorrhage, right eye: Secondary | ICD-10-CM | POA: Diagnosis not present

## 2015-10-27 DIAGNOSIS — E113593 Type 2 diabetes mellitus with proliferative diabetic retinopathy without macular edema, bilateral: Secondary | ICD-10-CM | POA: Diagnosis not present

## 2015-10-27 DIAGNOSIS — H4311 Vitreous hemorrhage, right eye: Secondary | ICD-10-CM | POA: Diagnosis not present

## 2015-11-09 ENCOUNTER — Ambulatory Visit: Payer: PPO | Admitting: Nutrition

## 2015-11-17 ENCOUNTER — Other Ambulatory Visit: Payer: Self-pay | Admitting: "Endocrinology

## 2015-11-22 ENCOUNTER — Other Ambulatory Visit: Payer: Self-pay | Admitting: Cardiology

## 2015-11-28 DIAGNOSIS — E113593 Type 2 diabetes mellitus with proliferative diabetic retinopathy without macular edema, bilateral: Secondary | ICD-10-CM | POA: Diagnosis not present

## 2015-11-30 ENCOUNTER — Ambulatory Visit: Payer: PPO | Admitting: "Endocrinology

## 2015-12-01 DIAGNOSIS — G4730 Sleep apnea, unspecified: Secondary | ICD-10-CM | POA: Diagnosis not present

## 2015-12-01 DIAGNOSIS — I10 Essential (primary) hypertension: Secondary | ICD-10-CM | POA: Diagnosis not present

## 2015-12-04 ENCOUNTER — Other Ambulatory Visit (HOSPITAL_COMMUNITY): Payer: Self-pay | Admitting: Respiratory Therapy

## 2015-12-04 DIAGNOSIS — G4730 Sleep apnea, unspecified: Secondary | ICD-10-CM

## 2015-12-13 DIAGNOSIS — E113593 Type 2 diabetes mellitus with proliferative diabetic retinopathy without macular edema, bilateral: Secondary | ICD-10-CM | POA: Diagnosis not present

## 2015-12-14 ENCOUNTER — Ambulatory Visit: Payer: PPO | Attending: Pulmonary Disease | Admitting: Neurology

## 2015-12-14 DIAGNOSIS — G4733 Obstructive sleep apnea (adult) (pediatric): Secondary | ICD-10-CM | POA: Insufficient documentation

## 2015-12-14 DIAGNOSIS — G4730 Sleep apnea, unspecified: Secondary | ICD-10-CM

## 2015-12-21 ENCOUNTER — Other Ambulatory Visit: Payer: Self-pay | Admitting: "Endocrinology

## 2015-12-21 DIAGNOSIS — N183 Chronic kidney disease, stage 3 (moderate): Secondary | ICD-10-CM | POA: Diagnosis not present

## 2015-12-21 DIAGNOSIS — E1122 Type 2 diabetes mellitus with diabetic chronic kidney disease: Secondary | ICD-10-CM | POA: Diagnosis not present

## 2015-12-21 LAB — HEMOGLOBIN A1C
Hgb A1c MFr Bld: 7.4 % — ABNORMAL HIGH (ref <5.7)
Mean Plasma Glucose: 166 mg/dL

## 2015-12-21 LAB — BASIC METABOLIC PANEL
BUN: 19 mg/dL (ref 7–25)
CALCIUM: 8.7 mg/dL (ref 8.6–10.3)
CO2: 25 mmol/L (ref 20–31)
CREATININE: 1.98 mg/dL — AB (ref 0.70–1.25)
Chloride: 107 mmol/L (ref 98–110)
GLUCOSE: 159 mg/dL — AB (ref 65–99)
Potassium: 3.9 mmol/L (ref 3.5–5.3)
Sodium: 142 mmol/L (ref 135–146)

## 2015-12-23 NOTE — Procedures (Signed)
[place] A. Merlene Laughter, MD     [email or web address]             NOCTURNAL POLYSOMNOGRAPHY   LOCATION: [facility]  Patient Name: Seth Werner, Seth Werner Date: 12/14/2015 Gender: Male D.O.B: 09-21-1955 Age (years): 80 Referring Provider: Not Available Height (inches): 65 Interpreting Physician: Phillips Odor MD, ABSM Weight (lbs): 283 RPSGT: Peak, Robert BMI: 47 MRN: [id number] Neck Size: 17.50 CLINICAL INFORMATION Sleep Study Type: NPSG Indication for sleep study: N/A Epworth Sleepiness Score: 1 SLEEP STUDY TECHNIQUE As per the AASM Manual for the Scoring of Sleep and Associated Events v2.3 (April 2016) with a hypopnea requiring 4% desaturations. The channels recorded and monitored were frontal, central and occipital EEG, electrooculogram (EOG), submentalis EMG (chin), nasal and oral airflow, thoracic and abdominal wall motion, anterior tibialis EMG, snore microphone, electrocardiogram, and pulse oximetry. MEDICATIONS Patient's medications include: N/A. Medications self-administered by patient during sleep study : No sleep medicine administered.  Current outpatient prescriptions:  .  amLODipine (NORVASC) 10 MG tablet, Take 10 mg by mouth daily before breakfast., Disp: , Rfl:  .  aspirin EC 81 MG tablet, Take 81 mg by mouth at bedtime. , Disp: , Rfl:  .  atorvastatin (LIPITOR) 40 MG tablet, TAKE ONE TABLET BY MOUTH DAILY, Disp: 30 tablet, Rfl: 6 .  carvedilol (COREG) 25 MG tablet, Take 25 mg by mouth 2 (two) times daily with a meal., Disp: , Rfl:  .  furosemide (LASIX) 80 MG tablet, Take 80 mg by mouth daily before breakfast., Disp: , Rfl:  .  glucose blood (ONE TOUCH ULTRA TEST) test strip, 2 x daily as directed. E11.65, Disp: 100 each, Rfl: 5 .  hydrALAZINE (APRESOLINE) 10 MG tablet, Take 1 tablet (10 mg total) by mouth 2 (two) times daily., Disp: 60 tablet, Rfl: 11 .  insulin aspart (NOVOLOG) 100 UNIT/ML FlexPen, Inject 10-16 Units into the skin 3 (three) times  daily with meals., Disp: 15 mL, Rfl: 3 .  Insulin Detemir (LEVEMIR FLEXTOUCH) 100 UNIT/ML Pen, INJECT 70 UNITS SUBCUTANEOUSLY DAILY, Disp: 30 mL, Rfl: 2 .  Lancets Thin MISC, Use as directed 4 x daily, Disp: 150 each, Rfl: 2 .  LITETOUCH PEN NEEDLES 31G X 8 MM MISC, USE AS DIRECTED FOUR TIMES DAILY, Disp: 100 each, Rfl: 3 .  losartan (COZAAR) 50 MG tablet, Take 50 mg by mouth every morning., Disp: , Rfl:  .  Multiple Vitamin (MULTIVITAMIN WITH MINERALS) TABS, Take 1 tablet by mouth daily., Disp: , Rfl:  .  nitroGLYCERIN (NITROSTAT) 0.4 MG SL tablet, Place 0.4 mg under the tongue as needed for chest pain., Disp: , Rfl:  .  pantoprazole (PROTONIX) 40 MG tablet, Take 40 mg by mouth daily at 6 (six) AM. One tablet every morning a hour before eating., Disp: , Rfl:  .  Potassium Chloride ER 20 MEQ TBCR, Take 0.5 tablets by mouth daily., Disp: , Rfl:  .  Simethicone (GAS-X EXTRA STRENGTH PO), Take 1 tablet by mouth as needed., Disp: , Rfl:  No current facility-administered medications for this visit.  Facility-Administered Medications Ordered in Other Visits:  .  0.9 %  sodium chloride infusion, 250 mL, Intravenous, PRN, Irine Seal, MD .  acetaminophen (TYLENOL) tablet 650 mg, 650 mg, Oral, Q4H PRN **OR** acetaminophen (TYLENOL) suppository 650 mg, 650 mg, Rectal, Q4H PRN, Irine Seal, MD .  ciprofloxacin (CIPRO) IVPB 400 mg, 400 mg, Intravenous, 60 min Pre-Op, Irine Seal, MD .  fentaNYL (SUBLIMAZE) injection 25-50 mcg, 25-50 mcg, Intravenous, Q2H PRN, Jenny Reichmann  Jeffie Pollock, MD .  oxyCODONE (Oxy IR/ROXICODONE) immediate release tablet 5-10 mg, 5-10 mg, Oral, Q4H PRN, Irine Seal, MD .  sodium chloride 0.9 % injection 3 mL, 3 mL, Intravenous, Q12H, Irine Seal, MD .  sodium chloride 0.9 % injection 3 mL, 3 mL, Intravenous, PRN, Irine Seal, MD  SLEEP ARCHITECTURE The study was initiated at 10:22:54 PM and ended at 5:08:36 AM. Sleep onset time was 40.9 minutes and the sleep efficiency was 65.0%. The total sleep time  was 263.8 minutes. Stage REM latency was 263.0 minutes. The patient spent 14.21% of the night in stage N1 sleep, 76.69% in stage N2 sleep, 0.00% in stage N3 and 9.10% in REM. Alpha intrusion was absent. Supine sleep was 69.11%.  Sleep was fragmented with frequent arousals.  RESPIRATORY PARAMETERS The overall apnea/hypopnea index (AHI) was 16.6 per hour. There were 41 total apneas, including 35 obstructive, 6 central and 0 mixed apneas. There were 32 hypopneas and 78 RERAs. The AHI during Stage REM sleep was 85.0 per hour. AHI while supine was 24.0 per hour. The mean oxygen saturation was 92.75%. The minimum SpO2 during sleep was 75.00%. Moderate snoring was noted during this study. CARDIAC DATA The 2 lead EKG demonstrated sinus rhythm. The mean heart rate was 68.54 beats per minute. Other EKG findings include: PVCs. LEG MOVEMENT DATA The total PLMS were 0 with a resulting PLMS index of 0.00. Associated arousal with leg movement index was 0.0.    IMPRESSIONS - Moderate obstructive sleep apnea worse during REM sleep. AutoPAP 7-15 is recommended.  - Abnormal sleep architecture with frequent arousals, fragmented sleep and absent slow wave sleep.    Delano Metz, MD Diplomate, American Board of Sleep Medicine.

## 2015-12-25 ENCOUNTER — Ambulatory Visit (INDEPENDENT_AMBULATORY_CARE_PROVIDER_SITE_OTHER): Payer: PPO | Admitting: "Endocrinology

## 2015-12-25 ENCOUNTER — Encounter: Payer: Self-pay | Admitting: "Endocrinology

## 2015-12-25 VITALS — BP 138/70 | HR 66 | Ht 65.0 in | Wt 284.0 lb

## 2015-12-25 DIAGNOSIS — E785 Hyperlipidemia, unspecified: Secondary | ICD-10-CM | POA: Diagnosis not present

## 2015-12-25 DIAGNOSIS — I10 Essential (primary) hypertension: Secondary | ICD-10-CM | POA: Diagnosis not present

## 2015-12-25 DIAGNOSIS — N183 Chronic kidney disease, stage 3 unspecified: Secondary | ICD-10-CM

## 2015-12-25 DIAGNOSIS — E1122 Type 2 diabetes mellitus with diabetic chronic kidney disease: Secondary | ICD-10-CM

## 2015-12-25 MED ORDER — ALBIGLUTIDE 30 MG ~~LOC~~ PEN: 30.0000 mg | PEN_INJECTOR | SUBCUTANEOUS | Status: DC

## 2015-12-25 MED ORDER — BASAGLAR KWIKPEN 100 UNIT/ML ~~LOC~~ SOPN: 70.0000 [IU] | PEN_INJECTOR | Freq: Every day | SUBCUTANEOUS | Status: DC
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
## 2015-12-25 NOTE — Patient Instructions (Signed)

## 2015-12-29 DIAGNOSIS — Z794 Long term (current) use of insulin: Secondary | ICD-10-CM | POA: Diagnosis not present

## 2015-12-29 DIAGNOSIS — N183 Chronic kidney disease, stage 3 (moderate): Secondary | ICD-10-CM | POA: Diagnosis not present

## 2015-12-29 DIAGNOSIS — E7800 Pure hypercholesterolemia, unspecified: Secondary | ICD-10-CM | POA: Diagnosis not present

## 2015-12-29 DIAGNOSIS — E1122 Type 2 diabetes mellitus with diabetic chronic kidney disease: Secondary | ICD-10-CM | POA: Diagnosis not present

## 2015-12-29 DIAGNOSIS — I10 Essential (primary) hypertension: Secondary | ICD-10-CM | POA: Diagnosis not present

## 2016-01-20 ENCOUNTER — Other Ambulatory Visit: Payer: Self-pay | Admitting: "Endocrinology

## 2016-01-25 DIAGNOSIS — Z8739 Personal history of other diseases of the musculoskeletal system and connective tissue: Secondary | ICD-10-CM | POA: Diagnosis not present

## 2016-01-25 DIAGNOSIS — M162 Bilateral osteoarthritis resulting from hip dysplasia: Secondary | ICD-10-CM | POA: Diagnosis not present

## 2016-01-25 DIAGNOSIS — M25552 Pain in left hip: Secondary | ICD-10-CM | POA: Diagnosis not present

## 2016-01-31 DIAGNOSIS — L600 Ingrowing nail: Secondary | ICD-10-CM | POA: Diagnosis not present

## 2016-01-31 DIAGNOSIS — E1140 Type 2 diabetes mellitus with diabetic neuropathy, unspecified: Secondary | ICD-10-CM | POA: Diagnosis not present

## 2016-01-31 DIAGNOSIS — E1151 Type 2 diabetes mellitus with diabetic peripheral angiopathy without gangrene: Secondary | ICD-10-CM | POA: Diagnosis not present

## 2016-01-31 DIAGNOSIS — B351 Tinea unguium: Secondary | ICD-10-CM | POA: Diagnosis not present

## 2016-02-01 ENCOUNTER — Other Ambulatory Visit: Payer: Self-pay | Admitting: "Endocrinology

## 2016-02-23 DIAGNOSIS — G4733 Obstructive sleep apnea (adult) (pediatric): Secondary | ICD-10-CM | POA: Diagnosis not present

## 2016-02-26 ENCOUNTER — Telehealth: Payer: Self-pay
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
## 2016-02-26 NOTE — Telephone Encounter (Signed)
We will consider changing to a different kind of insulin but he needs an office visit.

## 2016-02-29 NOTE — Telephone Encounter (Signed)
Called pt. No answer °

## 2016-03-08 NOTE — Telephone Encounter (Signed)
Pt coming in on 03-26-16

## 2016-03-09 ENCOUNTER — Other Ambulatory Visit: Payer: Self-pay | Admitting: "Endocrinology

## 2016-03-12 ENCOUNTER — Encounter: Payer: Self-pay | Admitting: *Deleted

## 2016-03-13 ENCOUNTER — Encounter: Payer: Self-pay | Admitting: Cardiology

## 2016-03-13 ENCOUNTER — Ambulatory Visit (INDEPENDENT_AMBULATORY_CARE_PROVIDER_SITE_OTHER): Payer: PPO | Admitting: Cardiology

## 2016-03-13 VITALS — BP 130/75 | HR 64 | Ht 65.0 in | Wt 290.0 lb

## 2016-03-13 DIAGNOSIS — R0789 Other chest pain: Secondary | ICD-10-CM | POA: Diagnosis not present

## 2016-03-13 DIAGNOSIS — G4733 Obstructive sleep apnea (adult) (pediatric): Secondary | ICD-10-CM | POA: Diagnosis not present

## 2016-03-13 DIAGNOSIS — I10 Essential (primary) hypertension: Secondary | ICD-10-CM

## 2016-03-13 DIAGNOSIS — E785 Hyperlipidemia, unspecified: Secondary | ICD-10-CM | POA: Diagnosis not present

## 2016-03-13 NOTE — Patient Instructions (Signed)
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
## 2016-03-15 DIAGNOSIS — E113593 Type 2 diabetes mellitus with proliferative diabetic retinopathy without macular edema, bilateral: Secondary | ICD-10-CM | POA: Diagnosis not present

## 2016-03-21 ENCOUNTER — Other Ambulatory Visit: Payer: Self-pay | Admitting: "Endocrinology

## 2016-03-21 LAB — COMPLETE METABOLIC PANEL WITH GFR
ALT: 57 U/L — ABNORMAL HIGH (ref 9–46)
AST: 45 U/L — ABNORMAL HIGH (ref 10–35)
Albumin: 3.8 g/dL (ref 3.6–5.1)
Alkaline Phosphatase: 64 U/L (ref 40–115)
BILIRUBIN TOTAL: 0.5 mg/dL (ref 0.2–1.2)
BUN: 23 mg/dL (ref 7–25)
CHLORIDE: 108 mmol/L (ref 98–110)
CO2: 27 mmol/L (ref 20–31)
Calcium: 9 mg/dL (ref 8.6–10.3)
Creat: 1.99 mg/dL — ABNORMAL HIGH (ref 0.70–1.25)
GFR, EST AFRICAN AMERICAN: 40 mL/min — AB (ref >=60)
GFR, EST NON AFRICAN AMERICAN: 35 mL/min — AB (ref >=60)
Glucose, Bld: 112 mg/dL — ABNORMAL HIGH (ref 65–99)
POTASSIUM: 4.3 mmol/L (ref 3.5–5.3)
SODIUM: 145 mmol/L (ref 135–146)
Total Protein: 6.2 g/dL (ref 6.1–8.1)

## 2016-03-22 ENCOUNTER — Ambulatory Visit (INDEPENDENT_AMBULATORY_CARE_PROVIDER_SITE_OTHER): Payer: PPO | Admitting: Urology

## 2016-03-22 DIAGNOSIS — Z8546 Personal history of malignant neoplasm of prostate: Secondary | ICD-10-CM | POA: Diagnosis not present

## 2016-03-22 DIAGNOSIS — N359 Urethral stricture, unspecified: Secondary | ICD-10-CM | POA: Diagnosis not present

## 2016-03-22 LAB — HEMOGLOBIN A1C
Hgb A1c MFr Bld: 7 % — ABNORMAL HIGH (ref <5.7)
MEAN PLASMA GLUCOSE: 154 mg/dL

## 2016-03-23 ENCOUNTER — Other Ambulatory Visit: Payer: Self-pay | Admitting: Cardiology

## 2016-03-25 DIAGNOSIS — G4733 Obstructive sleep apnea (adult) (pediatric): Secondary | ICD-10-CM | POA: Diagnosis not present

## 2016-03-26 ENCOUNTER — Other Ambulatory Visit: Payer: Self-pay | Admitting: "Endocrinology

## 2016-03-26 ENCOUNTER — Ambulatory Visit (INDEPENDENT_AMBULATORY_CARE_PROVIDER_SITE_OTHER): Payer: PPO | Admitting: "Endocrinology

## 2016-03-26 ENCOUNTER — Encounter: Payer: Self-pay | Admitting: "Endocrinology

## 2016-03-26 VITALS — BP 121/66 | HR 63 | Ht 65.0 in | Wt 289.0 lb

## 2016-03-26 DIAGNOSIS — E785 Hyperlipidemia, unspecified: Secondary | ICD-10-CM | POA: Diagnosis not present

## 2016-03-26 DIAGNOSIS — E1122 Type 2 diabetes mellitus with diabetic chronic kidney disease: Secondary | ICD-10-CM | POA: Diagnosis not present

## 2016-03-26 DIAGNOSIS — N183 Chronic kidney disease, stage 3 unspecified: Secondary | ICD-10-CM

## 2016-03-26 DIAGNOSIS — I10 Essential (primary) hypertension: Secondary | ICD-10-CM | POA: Diagnosis not present

## 2016-03-26 MED ORDER — GLUCOSE BLOOD VI STRP: ORAL_STRIP | 3 refills | Status: DC

## 2016-03-26 NOTE — Addendum Note (Signed)
Addended by: Lavell Luster A on: 03/26/2016 10:46 AM   Modules accepted: Orders

## 2016-03-26 NOTE — Patient Instructions (Signed)
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
## 2016-03-29 DIAGNOSIS — R339 Retention of urine, unspecified: Secondary | ICD-10-CM | POA: Diagnosis not present

## 2016-04-24 DIAGNOSIS — G4733 Obstructive sleep apnea (adult) (pediatric): Secondary | ICD-10-CM | POA: Diagnosis not present

## 2016-04-30 DIAGNOSIS — R339 Retention of urine, unspecified: Secondary | ICD-10-CM | POA: Diagnosis not present

## 2016-05-01 DIAGNOSIS — I10 Essential (primary) hypertension: Secondary | ICD-10-CM | POA: Diagnosis not present

## 2016-05-01 DIAGNOSIS — G4733 Obstructive sleep apnea (adult) (pediatric): Secondary | ICD-10-CM | POA: Diagnosis not present

## 2016-05-17 DIAGNOSIS — Z23 Encounter for immunization: Secondary | ICD-10-CM | POA: Diagnosis not present

## 2016-05-25 DIAGNOSIS — G4733 Obstructive sleep apnea (adult) (pediatric): Secondary | ICD-10-CM | POA: Diagnosis not present

## 2016-05-27 ENCOUNTER — Other Ambulatory Visit: Payer: Self-pay | Admitting: "Endocrinology

## 2016-05-29 DIAGNOSIS — N183 Chronic kidney disease, stage 3 (moderate): Secondary | ICD-10-CM | POA: Diagnosis not present

## 2016-05-29 DIAGNOSIS — R1013 Epigastric pain: Secondary | ICD-10-CM | POA: Diagnosis not present

## 2016-05-29 DIAGNOSIS — K219 Gastro-esophageal reflux disease without esophagitis: Secondary | ICD-10-CM | POA: Diagnosis not present

## 2016-05-30 DIAGNOSIS — G4733 Obstructive sleep apnea (adult) (pediatric): Secondary | ICD-10-CM | POA: Diagnosis not present

## 2016-06-02 DIAGNOSIS — N189 Chronic kidney disease, unspecified: Secondary | ICD-10-CM | POA: Diagnosis not present

## 2016-06-02 DIAGNOSIS — R109 Unspecified abdominal pain: Secondary | ICD-10-CM | POA: Diagnosis not present

## 2016-06-02 DIAGNOSIS — E7800 Pure hypercholesterolemia, unspecified: Secondary | ICD-10-CM | POA: Diagnosis not present

## 2016-06-02 DIAGNOSIS — Z7982 Long term (current) use of aspirin: Secondary | ICD-10-CM | POA: Diagnosis not present

## 2016-06-02 DIAGNOSIS — K219 Gastro-esophageal reflux disease without esophagitis: Secondary | ICD-10-CM | POA: Diagnosis not present

## 2016-06-02 DIAGNOSIS — Z79899 Other long term (current) drug therapy: Secondary | ICD-10-CM | POA: Diagnosis not present

## 2016-06-02 DIAGNOSIS — R935 Abnormal findings on diagnostic imaging of other abdominal regions, including retroperitoneum: Secondary | ICD-10-CM | POA: Diagnosis not present

## 2016-06-02 DIAGNOSIS — I10 Essential (primary) hypertension: Secondary | ICD-10-CM | POA: Diagnosis not present

## 2016-06-02 DIAGNOSIS — M549 Dorsalgia, unspecified: Secondary | ICD-10-CM | POA: Diagnosis not present

## 2016-06-02 DIAGNOSIS — I129 Hypertensive chronic kidney disease with stage 1 through stage 4 chronic kidney disease, or unspecified chronic kidney disease: Secondary | ICD-10-CM | POA: Diagnosis not present

## 2016-06-02 DIAGNOSIS — Z794 Long term (current) use of insulin: Secondary | ICD-10-CM | POA: Diagnosis not present

## 2016-06-02 DIAGNOSIS — E1022 Type 1 diabetes mellitus with diabetic chronic kidney disease: Secondary | ICD-10-CM | POA: Diagnosis not present

## 2016-06-02 DIAGNOSIS — M545 Low back pain: Secondary | ICD-10-CM | POA: Diagnosis not present

## 2016-06-03 DIAGNOSIS — R339 Retention of urine, unspecified: Secondary | ICD-10-CM | POA: Diagnosis not present

## 2016-06-04 DIAGNOSIS — S39012A Strain of muscle, fascia and tendon of lower back, initial encounter: Secondary | ICD-10-CM | POA: Diagnosis not present

## 2016-06-24 ENCOUNTER — Other Ambulatory Visit: Payer: Self-pay | Admitting: "Endocrinology

## 2016-06-24 DIAGNOSIS — E785 Hyperlipidemia, unspecified: Secondary | ICD-10-CM | POA: Diagnosis not present

## 2016-06-24 DIAGNOSIS — G4733 Obstructive sleep apnea (adult) (pediatric): Secondary | ICD-10-CM | POA: Diagnosis not present

## 2016-06-24 DIAGNOSIS — N183 Chronic kidney disease, stage 3 (moderate): Secondary | ICD-10-CM | POA: Diagnosis not present

## 2016-06-24 DIAGNOSIS — E1122 Type 2 diabetes mellitus with diabetic chronic kidney disease: Secondary | ICD-10-CM | POA: Diagnosis not present

## 2016-06-24 LAB — T4, FREE: Free T4: 1.1 ng/dL (ref 0.8–1.8)

## 2016-06-24 LAB — COMPLETE METABOLIC PANEL WITH GFR
ALT: 24 U/L (ref 9–46)
AST: 23 U/L (ref 10–35)
Albumin: 3.7 g/dL (ref 3.6–5.1)
Alkaline Phosphatase: 59 U/L (ref 40–115)
BILIRUBIN TOTAL: 0.4 mg/dL (ref 0.2–1.2)
BUN: 23 mg/dL (ref 7–25)
CALCIUM: 8.7 mg/dL (ref 8.6–10.3)
CO2: 28 mmol/L (ref 20–31)
CREATININE: 1.81 mg/dL — AB (ref 0.70–1.25)
Chloride: 108 mmol/L (ref 98–110)
GFR, EST AFRICAN AMERICAN: 45 mL/min — AB (ref >=60)
GFR, Est Non African American: 39 mL/min — ABNORMAL LOW (ref >=60)
Glucose, Bld: 57 mg/dL — ABNORMAL LOW (ref 65–99)
Potassium: 3.7 mmol/L (ref 3.5–5.3)
Sodium: 144 mmol/L (ref 135–146)
TOTAL PROTEIN: 5.9 g/dL — AB (ref 6.1–8.1)

## 2016-06-24 LAB — HEMOGLOBIN A1C
HEMOGLOBIN A1C: 6.9 % — AB (ref <5.7)
MEAN PLASMA GLUCOSE: 151 mg/dL

## 2016-06-24 LAB — LIPID PANEL
Cholesterol: 86 mg/dL (ref <200)
HDL: 36 mg/dL — ABNORMAL LOW (ref >40)
LDL CALC: 42 mg/dL (ref <100)
Total CHOL/HDL Ratio: 2.4 Ratio (ref <5.0)
Triglycerides: 41 mg/dL (ref <150)
VLDL: 8 mg/dL (ref <30)

## 2016-06-24 LAB — TSH: TSH: 0.91 m[IU]/L (ref 0.40–4.50)

## 2016-06-25 LAB — MICROALBUMIN / CREATININE URINE RATIO
CREATININE, URINE: 127 mg/dL (ref 20–370)
MICROALB UR: 18.8 mg/dL
Microalb Creat Ratio: 148 mcg/mg creat — ABNORMAL HIGH (ref <30)

## 2016-06-27 DIAGNOSIS — E1122 Type 2 diabetes mellitus with diabetic chronic kidney disease: Secondary | ICD-10-CM | POA: Diagnosis not present

## 2016-06-27 DIAGNOSIS — E7800 Pure hypercholesterolemia, unspecified: Secondary | ICD-10-CM | POA: Diagnosis not present

## 2016-06-27 DIAGNOSIS — I10 Essential (primary) hypertension: Secondary | ICD-10-CM | POA: Diagnosis not present

## 2016-06-27 DIAGNOSIS — N183 Chronic kidney disease, stage 3 (moderate): Secondary | ICD-10-CM | POA: Diagnosis not present

## 2016-07-01 ENCOUNTER — Encounter: Payer: Self-pay | Admitting: "Endocrinology

## 2016-07-01 ENCOUNTER — Ambulatory Visit (INDEPENDENT_AMBULATORY_CARE_PROVIDER_SITE_OTHER): Payer: PPO | Admitting: "Endocrinology

## 2016-07-01 VITALS — BP 141/74 | HR 74 | Ht 65.0 in | Wt 283.0 lb

## 2016-07-01 DIAGNOSIS — N183 Chronic kidney disease, stage 3 unspecified: Secondary | ICD-10-CM

## 2016-07-01 DIAGNOSIS — E782 Mixed hyperlipidemia: Secondary | ICD-10-CM

## 2016-07-01 DIAGNOSIS — E1122 Type 2 diabetes mellitus with diabetic chronic kidney disease: Secondary | ICD-10-CM

## 2016-07-01 DIAGNOSIS — I10 Essential (primary) hypertension: Secondary | ICD-10-CM | POA: Diagnosis not present
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
## 2016-07-01 NOTE — Patient Instructions (Signed)

## 2016-07-05 DIAGNOSIS — E7800 Pure hypercholesterolemia, unspecified: Secondary | ICD-10-CM | POA: Diagnosis not present

## 2016-07-05 DIAGNOSIS — N3091 Cystitis, unspecified with hematuria: Secondary | ICD-10-CM | POA: Diagnosis not present

## 2016-07-05 DIAGNOSIS — Z794 Long term (current) use of insulin: Secondary | ICD-10-CM | POA: Diagnosis not present

## 2016-07-05 DIAGNOSIS — N3001 Acute cystitis with hematuria: Secondary | ICD-10-CM | POA: Diagnosis not present

## 2016-07-05 DIAGNOSIS — K219 Gastro-esophageal reflux disease without esophagitis: Secondary | ICD-10-CM | POA: Diagnosis not present

## 2016-07-05 DIAGNOSIS — E1022 Type 1 diabetes mellitus with diabetic chronic kidney disease: Secondary | ICD-10-CM | POA: Diagnosis not present

## 2016-07-05 DIAGNOSIS — R319 Hematuria, unspecified: Secondary | ICD-10-CM | POA: Diagnosis not present

## 2016-07-05 DIAGNOSIS — Z923 Personal history of irradiation: Secondary | ICD-10-CM | POA: Diagnosis not present

## 2016-07-05 DIAGNOSIS — I129 Hypertensive chronic kidney disease with stage 1 through stage 4 chronic kidney disease, or unspecified chronic kidney disease: Secondary | ICD-10-CM | POA: Diagnosis not present

## 2016-07-05 DIAGNOSIS — N359 Urethral stricture, unspecified: Secondary | ICD-10-CM | POA: Diagnosis not present

## 2016-07-05 DIAGNOSIS — N99114 Postprocedural urethral stricture, male, unspecified: Secondary | ICD-10-CM | POA: Diagnosis not present

## 2016-07-05 DIAGNOSIS — N189 Chronic kidney disease, unspecified: Secondary | ICD-10-CM | POA: Diagnosis not present

## 2016-07-05 DIAGNOSIS — Z8546 Personal history of malignant neoplasm of prostate: Secondary | ICD-10-CM | POA: Diagnosis not present

## 2016-07-05 DIAGNOSIS — Z79899 Other long term (current) drug therapy: Secondary | ICD-10-CM | POA: Diagnosis not present

## 2016-07-12 ENCOUNTER — Other Ambulatory Visit: Payer: Self-pay | Admitting: "Endocrinology

## 2016-07-25 DIAGNOSIS — G4733 Obstructive sleep apnea (adult) (pediatric): Secondary | ICD-10-CM | POA: Diagnosis not present

## 2016-08-08 DIAGNOSIS — G4733 Obstructive sleep apnea (adult) (pediatric): Secondary | ICD-10-CM | POA: Diagnosis not present

## 2016-08-25 DIAGNOSIS — G4733 Obstructive sleep apnea (adult) (pediatric): Secondary | ICD-10-CM | POA: Diagnosis not present

## 2016-08-28 DIAGNOSIS — R339 Retention of urine, unspecified: Secondary | ICD-10-CM | POA: Diagnosis not present

## 2016-09-06 ENCOUNTER — Ambulatory Visit: Payer: PPO | Admitting: Urology

## 2016-09-06 DIAGNOSIS — N359 Urethral stricture, unspecified: Secondary | ICD-10-CM | POA: Diagnosis not present

## 2016-09-06 DIAGNOSIS — R310 Gross hematuria: Secondary | ICD-10-CM | POA: Diagnosis not present

## 2016-09-10 ENCOUNTER — Encounter (HOSPITAL_COMMUNITY): Payer: Self-pay | Admitting: Emergency Medicine

## 2016-09-10 ENCOUNTER — Inpatient Hospital Stay (HOSPITAL_COMMUNITY)
Admission: EM | Admit: 2016-09-10 | Discharge: 2016-09-12 | DRG: 669 | Disposition: A | Discharge status: Home / Self Care | Payer: PPO | Attending: Urology | Admitting: Urology

## 2016-09-10 DIAGNOSIS — B9620 Unspecified Escherichia coli [E. coli] as the cause of diseases classified elsewhere: Secondary | ICD-10-CM | POA: Diagnosis not present

## 2016-09-10 DIAGNOSIS — Z6841 Body Mass Index (BMI) 40.0 and over, adult: Secondary | ICD-10-CM

## 2016-09-10 DIAGNOSIS — E1122 Type 2 diabetes mellitus with diabetic chronic kidney disease: Secondary | ICD-10-CM | POA: Diagnosis not present

## 2016-09-10 DIAGNOSIS — N3041 Irradiation cystitis with hematuria: Principal | ICD-10-CM | POA: Diagnosis present

## 2016-09-10 DIAGNOSIS — Z8249 Family history of ischemic heart disease and other diseases of the circulatory system: Secondary | ICD-10-CM

## 2016-09-10 DIAGNOSIS — Z833 Family history of diabetes mellitus: Secondary | ICD-10-CM

## 2016-09-10 DIAGNOSIS — Z9049 Acquired absence of other specified parts of digestive tract: Secondary | ICD-10-CM

## 2016-09-10 DIAGNOSIS — K219 Gastro-esophageal reflux disease without esophagitis: Secondary | ICD-10-CM | POA: Diagnosis present

## 2016-09-10 DIAGNOSIS — N3001 Acute cystitis with hematuria: Secondary | ICD-10-CM

## 2016-09-10 DIAGNOSIS — R339 Retention of urine, unspecified: Secondary | ICD-10-CM | POA: Diagnosis present

## 2016-09-10 DIAGNOSIS — Z7982 Long term (current) use of aspirin: Secondary | ICD-10-CM

## 2016-09-10 DIAGNOSIS — Z79899 Other long term (current) drug therapy: Secondary | ICD-10-CM | POA: Diagnosis not present

## 2016-09-10 DIAGNOSIS — N359 Urethral stricture, unspecified: Secondary | ICD-10-CM | POA: Diagnosis not present

## 2016-09-10 DIAGNOSIS — Z8546 Personal history of malignant neoplasm of prostate: Secondary | ICD-10-CM

## 2016-09-10 DIAGNOSIS — R310 Gross hematuria: Secondary | ICD-10-CM

## 2016-09-10 DIAGNOSIS — Z791 Long term (current) use of non-steroidal anti-inflammatories (NSAID): Secondary | ICD-10-CM

## 2016-09-10 DIAGNOSIS — I129 Hypertensive chronic kidney disease with stage 1 through stage 4 chronic kidney disease, or unspecified chronic kidney disease: Secondary | ICD-10-CM | POA: Diagnosis present

## 2016-09-10 DIAGNOSIS — N183 Chronic kidney disease, stage 3 (moderate): Secondary | ICD-10-CM | POA: Diagnosis present

## 2016-09-10 DIAGNOSIS — Z794 Long term (current) use of insulin: Secondary | ICD-10-CM

## 2016-09-10 DIAGNOSIS — N35912 Unspecified bulbous urethral stricture, male: Secondary | ICD-10-CM

## 2016-09-10 DIAGNOSIS — Y842 Radiological procedure and radiotherapy as the cause of abnormal reaction of the patient, or of later complication, without mention of misadventure at the time of the procedure: Secondary | ICD-10-CM | POA: Diagnosis present

## 2016-09-10 DIAGNOSIS — R319 Hematuria, unspecified: Secondary | ICD-10-CM

## 2016-09-10 LAB — URINALYSIS, ROUTINE W REFLEX MICROSCOPIC

## 2016-09-10 LAB — URINALYSIS, MICROSCOPIC (REFLEX)
BACTERIA UA: NONE SEEN
SQUAMOUS EPITHELIAL / LPF: NONE SEEN

## 2016-09-10 NOTE — ED Triage Notes (Signed)
Patient c/o bright red blood with clots in his urine starting Friday. Patient was seen at urologist on Friday and scheduled for follow up. Reports an episode of bleeding yesterday and today. Hx prostate cancer x 10 years ago. Patient states he catheterizes himself daily.
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
## 2016-09-11 ENCOUNTER — Encounter (HOSPITAL_COMMUNITY)
Admission: EM | Disposition: A | Discharge status: Home / Self Care | Payer: Self-pay | Source: Home / Self Care | Attending: Urology

## 2016-09-11 ENCOUNTER — Emergency Department (HOSPITAL_COMMUNITY): Payer: PPO | Admitting: Anesthesiology

## 2016-09-11 ENCOUNTER — Emergency Department (HOSPITAL_COMMUNITY): Payer: PPO

## 2016-09-11 ENCOUNTER — Encounter (HOSPITAL_COMMUNITY): Payer: Self-pay | Admitting: Emergency Medicine

## 2016-09-11 DIAGNOSIS — N359 Urethral stricture, unspecified: Secondary | ICD-10-CM | POA: Diagnosis not present

## 2016-09-11 DIAGNOSIS — N3041 Irradiation cystitis with hematuria: Secondary | ICD-10-CM | POA: Diagnosis not present

## 2016-09-11 DIAGNOSIS — Z791 Long term (current) use of non-steroidal anti-inflammatories (NSAID): Secondary | ICD-10-CM | POA: Diagnosis not present

## 2016-09-11 DIAGNOSIS — N3289 Other specified disorders of bladder: Secondary | ICD-10-CM | POA: Diagnosis not present

## 2016-09-11 DIAGNOSIS — I129 Hypertensive chronic kidney disease with stage 1 through stage 4 chronic kidney disease, or unspecified chronic kidney disease: Secondary | ICD-10-CM | POA: Diagnosis not present

## 2016-09-11 DIAGNOSIS — N358 Other urethral stricture: Secondary | ICD-10-CM | POA: Diagnosis not present

## 2016-09-11 DIAGNOSIS — Z79899 Other long term (current) drug therapy: Secondary | ICD-10-CM | POA: Diagnosis not present

## 2016-09-11 DIAGNOSIS — R338 Other retention of urine: Secondary | ICD-10-CM | POA: Diagnosis not present

## 2016-09-11 DIAGNOSIS — K219 Gastro-esophageal reflux disease without esophagitis: Secondary | ICD-10-CM | POA: Diagnosis not present

## 2016-09-11 DIAGNOSIS — Z794 Long term (current) use of insulin: Secondary | ICD-10-CM | POA: Diagnosis not present

## 2016-09-11 DIAGNOSIS — Y842 Radiological procedure and radiotherapy as the cause of abnormal reaction of the patient, or of later complication, without mention of misadventure at the time of the procedure: Secondary | ICD-10-CM | POA: Diagnosis not present

## 2016-09-11 DIAGNOSIS — Z9049 Acquired absence of other specified parts of digestive tract: Secondary | ICD-10-CM | POA: Diagnosis not present

## 2016-09-11 DIAGNOSIS — Z7982 Long term (current) use of aspirin: Secondary | ICD-10-CM | POA: Diagnosis not present

## 2016-09-11 DIAGNOSIS — Z6841 Body Mass Index (BMI) 40.0 and over, adult: Secondary | ICD-10-CM | POA: Diagnosis not present

## 2016-09-11 DIAGNOSIS — E1122 Type 2 diabetes mellitus with diabetic chronic kidney disease: Secondary | ICD-10-CM | POA: Diagnosis not present

## 2016-09-11 DIAGNOSIS — N183 Chronic kidney disease, stage 3 (moderate): Secondary | ICD-10-CM | POA: Diagnosis not present

## 2016-09-11 DIAGNOSIS — R339 Retention of urine, unspecified: Secondary | ICD-10-CM | POA: Diagnosis not present

## 2016-09-11 DIAGNOSIS — Z833 Family history of diabetes mellitus: Secondary | ICD-10-CM | POA: Diagnosis not present

## 2016-09-11 DIAGNOSIS — R319 Hematuria, unspecified: Secondary | ICD-10-CM | POA: Diagnosis not present

## 2016-09-11 DIAGNOSIS — B9620 Unspecified Escherichia coli [E. coli] as the cause of diseases classified elsewhere: Secondary | ICD-10-CM | POA: Diagnosis not present

## 2016-09-11 DIAGNOSIS — Z8546 Personal history of malignant neoplasm of prostate: Secondary | ICD-10-CM | POA: Diagnosis not present

## 2016-09-11 DIAGNOSIS — Z8249 Family history of ischemic heart disease and other diseases of the circulatory system: Secondary | ICD-10-CM | POA: Diagnosis not present

## 2016-09-11 DIAGNOSIS — R310 Gross hematuria: Secondary | ICD-10-CM | POA: Diagnosis not present

## 2016-09-11 HISTORY — PX: CYSTOSCOPY WITH FULGERATION: SHX6638

## 2016-09-11 LAB — GLUCOSE, CAPILLARY
GLUCOSE-CAPILLARY: 178 mg/dL — AB (ref 65–99)
GLUCOSE-CAPILLARY: 184 mg/dL — AB (ref 65–99)
GLUCOSE-CAPILLARY: 127 mg/dL — AB (ref 65–99)
Glucose-Capillary: 176 mg/dL — ABNORMAL HIGH (ref 65–99)
Glucose-Capillary: 156 mg/dL — ABNORMAL HIGH (ref 65–99)

## 2016-09-11 LAB — I-STAT CHEM 8, ED
BUN: 35 mg/dL — ABNORMAL HIGH (ref 6–20)
CALCIUM ION: 1.07 mmol/L — AB (ref 1.15–1.40)
CREATININE: 1.9 mg/dL — AB (ref 0.61–1.24)
Chloride: 105 mmol/L (ref 101–111)
GLUCOSE: 140 mg/dL — AB (ref 65–99)
HCT: 40 % (ref 39.0–52.0)
HEMOGLOBIN: 13.6 g/dL (ref 13.0–17.0)
POTASSIUM: 4.4 mmol/L (ref 3.5–5.1)
Sodium: 142 mmol/L (ref 135–145)
TCO2: 27 mmol/L (ref 0–100)

## 2016-09-11 LAB — CBC WITH DIFFERENTIAL/PLATELET
Basophils Absolute: 0 10*3/uL (ref 0.0–0.1)
Basophils Relative: 0 %
EOS PCT: 2 %
Eosinophils Absolute: 0.1 10*3/uL (ref 0.0–0.7)
HEMATOCRIT: 39.6 % (ref 39.0–52.0)
Hemoglobin: 13.2 g/dL (ref 13.0–17.0)
LYMPHS ABS: 2.6 10*3/uL (ref 0.7–4.0)
LYMPHS PCT: 29 %
MCH: 29.7 pg (ref 26.0–34.0)
MCHC: 33.3 g/dL (ref 30.0–36.0)
MCV: 89.2 fL (ref 78.0–100.0)
MONO ABS: 0.7 10*3/uL (ref 0.1–1.0)
MONOS PCT: 8 %
Neutro Abs: 5.3 10*3/uL (ref 1.7–7.7)
Neutrophils Relative %: 61 %
Platelets: 242 10*3/uL (ref 150–400)
RBC: 4.44 MIL/uL (ref 4.22–5.81)
RDW: 13.6 % (ref 11.5–15.5)
WBC: 8.7 10*3/uL (ref 4.0–10.5)

## 2016-09-11 SURGERY — CYSTOSCOPY, WITH BLADDER FULGURATION
Anesthesia: General | Site: Perineum

## 2016-09-11 MED ORDER — MIDAZOLAM HCL 5 MG/5ML IJ SOLN
INTRAMUSCULAR | Status: DC | PRN
  Administered 2016-09-11: 2 mg via INTRAVENOUS

## 2016-09-11 MED ORDER — FUROSEMIDE 40 MG PO TABS
80.0000 mg | ORAL_TABLET | Freq: Every day | ORAL | Status: DC
  Administered 2016-09-11 – 2016-09-12 (×2): 80 mg via ORAL
  Filled 2016-09-11 (×2): qty 2

## 2016-09-11 MED ORDER — MEPERIDINE HCL 25 MG/ML IJ SOLN
INTRAMUSCULAR | Status: DC | PRN
  Administered 2016-09-11: 12.5 mg via INTRAVENOUS

## 2016-09-11 MED ORDER — FENTANYL CITRATE (PF) 100 MCG/2ML IJ SOLN
INTRAMUSCULAR | Status: AC
  Filled 2016-09-11: qty 2

## 2016-09-11 MED ORDER — SODIUM CHLORIDE 0.9 % IV SOLN
INTRAVENOUS | Status: DC | PRN
  Administered 2016-09-11: 05:00:00

## 2016-09-11 MED ORDER — LACTATED RINGERS IV SOLN: INTRAVENOUS | Status: DC

## 2016-09-11 MED ORDER — INSULIN ASPART 100 UNIT/ML ~~LOC~~ SOLN: 4.0000 [IU] | Freq: Three times a day (TID) | SUBCUTANEOUS | Status: DC

## 2016-09-11 MED ORDER — MIDAZOLAM HCL 2 MG/2ML IJ SOLN
INTRAMUSCULAR | Status: AC
  Filled 2016-09-11: qty 2

## 2016-09-11 MED ORDER — LIDOCAINE 2% (20 MG/ML) 5 ML SYRINGE
INTRAMUSCULAR | Status: DC | PRN
  Administered 2016-09-11: 50 mg via INTRAVENOUS

## 2016-09-11 MED ORDER — ONDANSETRON HCL 4 MG/2ML IJ SOLN
4.0000 mg | Freq: Four times a day (QID) | INTRAMUSCULAR | Status: DC | PRN
  Administered 2016-09-11: 4 mg via INTRAVENOUS
  Filled 2016-09-11: qty 2

## 2016-09-11 MED ORDER — ONDANSETRON HCL 4 MG/2ML IJ SOLN
INTRAMUSCULAR | Status: AC
  Filled 2016-09-11: qty 2

## 2016-09-11 MED ORDER — FAMOTIDINE 20 MG PO TABS
20.0000 mg | ORAL_TABLET | Freq: Every day | ORAL | Status: DC
  Administered 2016-09-11 – 2016-09-12 (×2): 20 mg via ORAL
  Filled 2016-09-11 (×2): qty 1

## 2016-09-11 MED ORDER — LOSARTAN POTASSIUM 50 MG PO TABS
50.0000 mg | ORAL_TABLET | Freq: Every morning | ORAL | Status: DC
  Administered 2016-09-11 – 2016-09-12 (×2): 50 mg via ORAL
  Filled 2016-09-11 (×2): qty 1

## 2016-09-11 MED ORDER — HYDROMORPHONE HCL 1 MG/ML IJ SOLN: 0.5000 mg | INTRAMUSCULAR | Status: DC | PRN

## 2016-09-11 MED ORDER — DEXTROSE 5 % IV SOLN
2.0000 g | INTRAVENOUS | Status: DC
  Administered 2016-09-11: 2 g via INTRAVENOUS
  Filled 2016-09-11 (×2): qty 2

## 2016-09-11 MED ORDER — ATORVASTATIN CALCIUM 40 MG PO TABS
40.0000 mg | ORAL_TABLET | Freq: Every day | ORAL | Status: DC
  Administered 2016-09-11: 40 mg via ORAL
  Filled 2016-09-11: qty 1

## 2016-09-11 MED ORDER — PROPOFOL 10 MG/ML IV BOLUS
INTRAVENOUS | Status: AC
  Filled 2016-09-11: qty 20

## 2016-09-11 MED ORDER — MEPERIDINE HCL 50 MG/ML IJ SOLN
INTRAMUSCULAR | Status: AC
  Administered 2016-09-11: 12.5 mg via INTRAVENOUS
  Filled 2016-09-11: qty 1

## 2016-09-11 MED ORDER — SODIUM CHLORIDE 0.9 % IR SOLN
Status: DC | PRN
  Administered 2016-09-11: 3000 mL

## 2016-09-11 MED ORDER — AMLODIPINE BESYLATE 10 MG PO TABS
10.0000 mg | ORAL_TABLET | Freq: Every day | ORAL | Status: DC
  Administered 2016-09-11 – 2016-09-12 (×2): 10 mg via ORAL
  Filled 2016-09-11 (×2): qty 1

## 2016-09-11 MED ORDER — ACETAMINOPHEN 500 MG PO TABS
1000.0000 mg | ORAL_TABLET | Freq: Three times a day (TID) | ORAL | Status: AC
  Administered 2016-09-11 (×3): 1000 mg via ORAL
  Filled 2016-09-11 (×3): qty 2

## 2016-09-11 MED ORDER — PROPOFOL 10 MG/ML IV BOLUS
INTRAVENOUS | Status: DC | PRN
  Administered 2016-09-11: 200 mg via INTRAVENOUS

## 2016-09-11 MED ORDER — FENTANYL CITRATE (PF) 100 MCG/2ML IJ SOLN
INTRAMUSCULAR | Status: DC | PRN
  Administered 2016-09-11 (×3): 25 ug via INTRAVENOUS

## 2016-09-11 MED ORDER — INSULIN ASPART 100 UNIT/ML ~~LOC~~ SOLN
0.0000 [IU] | Freq: Three times a day (TID) | SUBCUTANEOUS | Status: DC
  Administered 2016-09-11: 4 [IU] via SUBCUTANEOUS
  Administered 2016-09-11: 3 [IU] via SUBCUTANEOUS
  Administered 2016-09-11: 4 [IU] via SUBCUTANEOUS
  Administered 2016-09-12: 3 [IU] via SUBCUTANEOUS
  Administered 2016-09-12: 7 [IU] via SUBCUTANEOUS

## 2016-09-11 MED ORDER — FENTANYL CITRATE (PF) 100 MCG/2ML IJ SOLN
25.0000 ug | INTRAMUSCULAR | Status: DC | PRN
  Administered 2016-09-11: 50 ug via INTRAVENOUS

## 2016-09-11 MED ORDER — ONDANSETRON HCL 4 MG/2ML IJ SOLN
INTRAMUSCULAR | Status: DC | PRN
  Administered 2016-09-11: 4 mg via INTRAVENOUS

## 2016-09-11 MED ORDER — HYDRALAZINE HCL 10 MG PO TABS
10.0000 mg | ORAL_TABLET | Freq: Two times a day (BID) | ORAL | Status: DC
  Administered 2016-09-11 – 2016-09-12 (×3): 10 mg via ORAL
  Filled 2016-09-11 (×3): qty 1

## 2016-09-11 MED ORDER — OXYCODONE HCL 5 MG PO TABS: 5.0000 mg | ORAL_TABLET | ORAL | Status: DC | PRN

## 2016-09-11 MED ORDER — DEXTROSE 5 % IV SOLN
2.0000 g | Freq: Once | INTRAVENOUS | Status: AC
  Administered 2016-09-11: 04:00:00 via INTRAVENOUS
  Filled 2016-09-11: qty 2

## 2016-09-11 MED ORDER — PROMETHAZINE HCL 25 MG/ML IJ SOLN: 6.2500 mg | INTRAMUSCULAR | Status: DC | PRN

## 2016-09-11 MED ORDER — FENTANYL CITRATE (PF) 100 MCG/2ML IJ SOLN
50.0000 ug | Freq: Once | INTRAMUSCULAR | Status: AC
  Administered 2016-09-11: 50 ug via INTRAVENOUS
  Filled 2016-09-11: qty 2

## 2016-09-11 MED ORDER — CIPROFLOXACIN IN D5W 400 MG/200ML IV SOLN
INTRAVENOUS | Status: AC
  Filled 2016-09-11: qty 200

## 2016-09-11 MED ORDER — LIDOCAINE 2% (20 MG/ML) 5 ML SYRINGE
INTRAMUSCULAR | Status: AC
  Filled 2016-09-11: qty 5

## 2016-09-11 MED ORDER — INSULIN ASPART 100 UNIT/ML ~~LOC~~ SOLN: 0.0000 [IU] | Freq: Every day | SUBCUTANEOUS | Status: DC

## 2016-09-11 MED ORDER — SENNOSIDES-DOCUSATE SODIUM 8.6-50 MG PO TABS
1.0000 | ORAL_TABLET | Freq: Two times a day (BID) | ORAL | Status: DC
  Administered 2016-09-11 (×2): 1 via ORAL
  Filled 2016-09-11 (×3): qty 1

## 2016-09-11 MED ORDER — SODIUM CHLORIDE 0.9 % IV SOLN
INTRAVENOUS | Status: DC
  Administered 2016-09-11: 07:00:00 via INTRAVENOUS

## 2016-09-11 MED ORDER — LACTATED RINGERS IV SOLN
INTRAVENOUS | Status: DC | PRN
  Administered 2016-09-11: 04:00:00 via INTRAVENOUS

## 2016-09-11 SURGICAL SUPPLY — 16 items
BAG URINE DRAINAGE (UROLOGICAL SUPPLIES) IMPLANT
BAG URO CATCHER STRL LF (MISCELLANEOUS) ×2 IMPLANT
BALLN NEPHROSTOMY (BALLOONS) ×2
BALLOON NEPHROSTOMY (BALLOONS) ×1 IMPLANT
ELECT REM PT RETURN 9FT ADLT (ELECTROSURGICAL) ×2
ELECTRODE REM PT RTRN 9FT ADLT (ELECTROSURGICAL) ×1 IMPLANT
EVACUATOR MICROVAS BLADDER (UROLOGICAL SUPPLIES) IMPLANT
GLOVE BIOGEL M STRL SZ7.5 (GLOVE) ×2 IMPLANT
GOWN STRL REUS W/TWL LRG LVL3 (GOWN DISPOSABLE) ×4 IMPLANT
GUIDEWIRE STR DUAL SENSOR (WIRE) ×2 IMPLANT
LOOP CUT BIPOLAR 24F LRG (ELECTROSURGICAL) IMPLANT
MANIFOLD NEPTUNE II (INSTRUMENTS) ×2 IMPLANT
PACK CYSTO (CUSTOM PROCEDURE TRAY) ×2 IMPLANT
SET ASPIRATION TUBING (TUBING) IMPLANT
SYRINGE IRR TOOMEY STRL 70CC (SYRINGE) ×2 IMPLANT
TUBING CONNECTING 10 (TUBING) ×2 IMPLANT

## 2016-09-11 NOTE — Anesthesia Procedure Notes (Signed)
Procedure Name: LMA Insertion Date/Time: 09/11/2016 4:35 AM Performed by: Anne Fu Pre-anesthesia Checklist: Patient identified, Emergency Drugs available, Suction available, Patient being monitored and Timeout performed Patient Re-evaluated:Patient Re-evaluated prior to inductionOxygen Delivery Method: Circle system utilized Preoxygenation: Pre-oxygenation with 100% oxygen Intubation Type: IV induction Ventilation: Mask ventilation without difficulty LMA: LMA inserted LMA Size: 4.0 Number of attempts: 1 Placement Confirmation: positive ETCO2 and breath sounds checked- equal and bilateral Tube secured with: Tape

## 2016-09-11 NOTE — Anesthesia Preprocedure Evaluation (Addendum)
Anesthesia Evaluation  Patient identified by MRN, date of birth, ID band Patient awake    Reviewed: Allergy & Precautions, NPO status , Patient's Chart, lab work & pertinent test results  Airway Mallampati: II  TM Distance: <3 FB Neck ROM: Full    Dental no notable dental hx.    Pulmonary neg pulmonary ROS,    Pulmonary exam normal breath sounds clear to auscultation       Cardiovascular hypertension, negative cardio ROS Normal cardiovascular exam Rhythm:Regular Rate:Normal     Neuro/Psych negative neurological ROS  negative psych ROS   GI/Hepatic negative GI ROS, Neg liver ROS,   Endo/Other  diabetesMorbid obesity  Renal/GU Renal InsufficiencyRenal disease  negative genitourinary   Musculoskeletal negative musculoskeletal ROS (+)   Abdominal (+) + obese,   Peds negative pediatric ROS (+)  Hematology negative hematology ROS (+)   Anesthesia Other Findings   Reproductive/Obstetrics negative OB ROS                             Anesthesia Physical Anesthesia Plan  ASA: III  Anesthesia Plan: General   Post-op Pain Management:    Induction: Intravenous  Airway Management Planned: LMA  Additional Equipment:   Intra-op Plan:   Post-operative Plan: Extubation in OR  Informed Consent: I have reviewed the patients History and Physical, chart, labs and discussed the procedure including the risks, benefits and alternatives for the proposed anesthesia with the patient or authorized representative who has indicated his/her understanding and acceptance.   Dental advisory given  Plan Discussed with: CRNA and Surgeon  Anesthesia Plan Comments:        Anesthesia Quick Evaluation

## 2016-09-11 NOTE — Transfer of Care (Signed)
Immediate Anesthesia Transfer of Care Note  Patient: Seth Werner  Procedure(s) Performed: Procedure(s): CYSTOSCOPY WITH FULGERATION CLOT EVACUATION URETHRAL DILATION, (N/A)  Patient Location: PACU  Anesthesia Type:General  Level of Consciousness:  sedated, patient cooperative and responds to stimulation  Airway & Oxygen Therapy:Patient Spontanous Breathing and Patient connected to face mask oxgen  Post-op Assessment:  Report given to PACU RN and Post -op Vital signs reviewed and stable  Post vital signs:  Reviewed and stable  Last Vitals:  Vitals:   09/11/16 0130 09/11/16 0355  BP: 143/62 179/71  Pulse: 76 81  Resp:  17  Temp:  123456 C    Complications: No apparent anesthesia complications

## 2016-09-11 NOTE — ED Notes (Signed)
Transport to OR
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
## 2016-09-11 NOTE — Brief Op Note (Signed)
09/10/2016 - 09/11/2016  5:06 AM  PATIENT:  Seth Werner  61 y.o. male  PRE-OPERATIVE DIAGNOSIS:  Bladder blood clot, urinary retention  POST-OPERATIVE DIAGNOSIS:  bladder blood clot, urinary retention, radiation cystitis  PROCEDURE:  Procedure(s): CYSTOSCOPY WITH FULGERATION CLOT EVACUATION URETHRAL DILATION, (N/A)  SURGEON:  Surgeon(s) and Role:    * Alexis Frock, MD - Primary  PHYSICIAN ASSISTANT:   ASSISTANTS: none   ANESTHESIA:   general  EBL:  No intake/output data recorded.  BLOOD ADMINISTERED:none  DRAINS: 12F 3 way foley to NS irrigation   LOCAL MEDICATIONS USED:  NONE  SPECIMEN:  No Specimen  DISPOSITION OF SPECIMEN:  N/A  COUNTS:  YES  TOURNIQUET:  * No tourniquets in log *  DICTATION: .Other Dictation: Dictation Number [id number]  PLAN OF CARE: Admit for overnight observation  PATIENT DISPOSITION:  PACU - hemodynamically stable.   Delay start of Pharmacological VTE agent (>24hrs) due to surgical blood loss or risk of bleeding: yes

## 2016-09-11 NOTE — Op Note (Signed)
NAMEMarland Kitchen  MYERS, BOTZ NO.:  [id number]  MEDICAL RECORD NO.:  [id number]  LOCATION:                                 FACILITY:  PHYSICIAN:  Alexis Frock, MD     DATE OF BIRTH:  [birth year]  DATE OF PROCEDURE: 09/11/2016                              OPERATIVE REPORT   PREOPERATIVE DIAGNOSIS:  Clo, urinary retention.  POSTOPERATIVE DIAGNOSES:  Clot urinary retention, urethral stricture, radiation cystitis.  PROCEDURES: 1. Cystoscopy with clot evacuation, fulguration of bleeders. 2. Urethral dilation.  ESTIMATED BLOOD LOSS:  Approximately 200 mL, formed clot, minimal new blood.  FINDINGS: 1. Dense bulbar stricture, approximately 16-French predilation and 30-     French postdilation. 2. Bladder neck radiation cystitis changes. 3. 250 mL formed clot in the urinary bladder. 4. Complete resolution of all active bleeding following fulguration of     bleeders.  DRAINS:  24-French 3-Foley catheter, normal saline irrigation; efflux, light pink.  INDICATION:  Mr. Vanous is a 61 year old gentleman with remote history of prostate cancer, status post radiation therapy in the distant past.  He has had problematic hematuria that has been increasing as well as history of known long-term urethral stricture that he preforms self- dilation for periodically.  He presented in frank clot retention overnight.  Given his complex history, it was clearly felt that operative intervention with clot evacuation, fulguration of bleeders, dilation would be warranted.  Informed consent was obtained and placed in the medical record.  PROCEDURE IN DETAIL:  The patient being, Donnell Burdon, was verified. Procedure being cysto, urethral dilation, cysto clot evac, fulguration was confirmed.  Procedure was carried out.  Time-out was performed. Intravenous antibiotics were administered.  General anesthesia was introduced.  The patient was placed into a low lithotomy position and sterile field  was created by prepping and draping the patient's penis, perineum and proximal thighs using iodine.  Next, cystourethroscopy was performed using a rigid cystoscope with offset lens.  Inspection of the anterior urethra was unremarkable.  Inspection of the posterior urethra revealed a dense stricture disease at the area of the bulbar urethra, this could not be navigated with rigid cystoscope, this estimated to be approximately 14-16-French.  As such, a Sensor wire was navigated under fluoroscopic vision to the level of the urinary bladder, over which, a 30-French balloon dilation apparatus, NephroMax type was navigated across the area from the bladder neck to the pendulous urethra, this was inflated to pressure of 20 atmospheres, held for 90 seconds.  There was wasting, that was resolved with fluoroscopy and this was removed.  Next, cystoscopy revealed resolution of the area of stricture all the way to the level of urinary bladder.  The prostate and bladder neck were fixed consistent with prior radiation.  There was a significant amount of formed clot in the bladder.  As such, the cystoscope was exchanged for the 26-French continuous flow resectoscope sheath and clot evacuation was performed, approximately 200 mL of formed clot within the urinary bladder.  Next, visualization revealed as expected radiation cystitis changes to the area of the bladder neck.  There was relative sparing of the ureteral orifices, there was active mucosal  bleeding.  As such, bipolar loop was used to fulgurate the area of the bladder neck and prostatic fossa.  The areas of active mucosal bleeding from radiation cystitis changes, which resulted in excellent hemostasis.  Exquisite care was taken to avoid injury to ureteral orifices, which did not occur.  Following these maneuvers, there was excellent hemostasis, complete resolution of all blood clot.  The cystoscope was exchanged for new 24-French 3-Foley catheter,  20 mL sterile water in the balloon. This was connected to normal saline irrigation, efflux was light pink and the procedure was terminated.  The patient tolerated the procedure well.  There were no immediate periprocedural complications.  The patient was taken to the postanesthesia care unit in stable condition.    ______________________________ Alexis Frock, MD   ______________________________ Alexis Frock, MD    TM/MEDQ  D:  09/11/2016  T:  09/11/2016  Job:  [id number]

## 2016-09-12 ENCOUNTER — Encounter (HOSPITAL_COMMUNITY): Payer: Self-pay | Admitting: Urology

## 2016-09-12 DIAGNOSIS — N3001 Acute cystitis with hematuria: Secondary | ICD-10-CM

## 2016-09-12 DIAGNOSIS — N35912 Unspecified bulbous urethral stricture, male: Secondary | ICD-10-CM

## 2016-09-12 LAB — GLUCOSE, CAPILLARY
GLUCOSE-CAPILLARY: 146 mg/dL — AB (ref 65–99)
GLUCOSE-CAPILLARY: 212 mg/dL — AB (ref 65–99)

## 2016-09-12 MED ORDER — SULFAMETHOXAZOLE-TRIMETHOPRIM 800-160 MG PO TABS: 1.0000 | ORAL_TABLET | Freq: Two times a day (BID) | ORAL | 0 refills | Status: DC

## 2016-09-12 NOTE — Anesthesia Postprocedure Evaluation (Addendum)
Anesthesia Post Note  Patient: Seth Werner  Procedure(s) Performed: Procedure(s) (LRB): CYSTOSCOPY WITH FULGERATION CLOT EVACUATION URETHRAL DILATION, (N/A)  Patient location during evaluation: PACU Anesthesia Type: General Level of consciousness: awake and alert Pain management: pain level controlled Vital Signs Assessment: post-procedure vital signs reviewed and stable Respiratory status: spontaneous breathing, nonlabored ventilation, respiratory function stable and patient connected to nasal cannula oxygen Cardiovascular status: blood pressure returned to baseline and stable Postop Assessment: no signs of nausea or vomiting Anesthetic complications: no       Last Vitals:  Vitals:   09/11/16 2356 09/12/16 0531  BP: (!) 120/51 (!) 119/52  Pulse: 92 90  Resp: 20 20  Temp: 37.1 C 37.1 C    Last Pain:  Vitals:   09/12/16 0821  TempSrc:   PainSc: 1                  Tremond Shimabukuro S

## 2016-09-12 NOTE — Care Management Note (Signed)
Case Management Note  Patient Details  Name: Seth Werner MRN: [id number] Date of Birth: [birth year]  Subjective/Objective:                    Action/Plan:d/c plan home.   Expected Discharge Date:  09/12/16               Expected Discharge Plan:  Home/Self Care  In-House Referral:     Discharge planning Services  CM Consult  Post Acute Care Choice:    Choice offered to:     DME Arranged:    DME Agency:     HH Arranged:    HH Agency:     Status of Service:  Completed, signed off  If discussed at [employer] of Stay Meetings, dates discussed:    Additional Comments:  Dessa Phi, RN 09/12/2016, 11:28 AM

## 2016-09-12 NOTE — Discharge Summary (Signed)
Physician Discharge Summary  Patient ID: Seth Werner MRN: [id number] DOB/AGE: 63-Feb-1957 63 y.o.  Admit date: 09/10/2016 Discharge date: 09/12/2016  Admission Diagnoses:  Urinary retention  Discharge Diagnoses:  Principal Problem:   Urinary retention Active Problems:   Acute cystitis with hematuria   Bulbous urethral stricture   Past Medical History:  Diagnosis Date  . Abnormal EKG, lat ST depression 08/23/2013  . Cancer (HCC)    HX PROSTATE CANCER-TX'D WITH RADIATION  . Chronic kidney disease    HX OF ACUTE RENAL FAILURE 2008 -SEE NEPHROLOGIST DR. Lowanda Foster -LAST OFFICE NOTE 05/26/12  STATES CHRONIC RENAL FAILURE STAGE III-HX PROTEINURIA-STARTED ON ACE INHIBITOR-BUN 19 AND CREAT 1.65 ON 05/20/12  . Diabetes type 2, controlled ([place]) 08/23/2013  . GERD (gastroesophageal reflux disease)     OCCAS REFLUX-NO MEDS  . Hypertension   . Urethral stricture    PT SAYS RELATED TO PREVIOUS RADIATION TX FOR PROSTATE CANCER    Surgeries: Procedure(s): CYSTOSCOPY WITH FULGERATION CLOT EVACUATION URETHRAL DILATION, on 09/10/2016 - 09/11/2016   Consultants (if any):   Discharged Condition: Improved  Hospital Course: ISHAK KEAST is an 61 y.o. male who was admitted 09/10/2016 with a diagnosis of Urinary retention and went to the operating room on 09/10/2016 - 09/11/2016 and underwent the above named procedures.  He was found to have radiation cystitis with clot retention and a urethral stricture that was dilated.   His culture is growing e. Coli.  The sensitivities are pending.  He had a fever to 101.5 on 2/21 but that has resolved.  The foley was removed this morning and he is voiding clear urine.  His Cr was 1.9 on admission but that is stable.  I will send him home on bactrim after he is sure he is voiding comfortably.    He was given perioperative antibiotics:  Anti-infectives    Start     Dose/Rate Route Frequency Ordered Stop   09/12/16 0000  sulfamethoxazole-trimethoprim (BACTRIM DS,SEPTRA DS)  800-160 MG tablet     1 tablet Oral 2 times daily 09/12/16 0946     09/11/16 2200  cefTRIAXone (ROCEPHIN) 2 g in dextrose 5 % 50 mL IVPB     2 g 100 mL/hr over 30 Minutes Intravenous Every 24 hours 09/11/16 0846     09/11/16 0345  cefTRIAXone (ROCEPHIN) 2 g in dextrose 5 % 50 mL IVPB     2 g 100 mL/hr over 30 Minutes Intravenous  Once 09/11/16 0333 09/11/16 0419    .  He was given sequential compression devices,for DVT prophylaxis.  He benefited maximally from the hospital stay and there were no complications.    Recent vital signs:  Vitals:   09/11/16 2356 09/12/16 0531  BP: (!) 120/51 (!) 119/52  Pulse: 92 90  Resp: 20 20  Temp: 98.8 F (37.1 C) 98.7 F (37.1 C)    Recent laboratory studies:  Lab Results  Component Value Date   HGB 13.6 09/11/2016   HGB 13.2 09/11/2016   HGB 13.1 08/23/2013   Lab Results  Component Value Date   WBC 8.7 09/11/2016   PLT 242 09/11/2016   No results found for: INR Lab Results  Component Value Date   NA 142 09/11/2016   K 4.4 09/11/2016   CL 105 09/11/2016   CO2 28 06/24/2016   BUN 35 (H) 09/11/2016   CREATININE 1.90 (H) 09/11/2016   GLUCOSE 140 (H) 09/11/2016    Discharge Medications:   Allergies as of 09/12/2016  No Known Allergies     Medication List    TAKE these medications   amLODipine 10 MG tablet Commonly known as:  NORVASC Take 10 mg by mouth daily before breakfast.   aspirin EC 81 MG tablet Take 81 mg by mouth at bedtime.   atorvastatin 40 MG tablet Commonly known as:  LIPITOR TAKE ONE TABLET BY MOUTH DAILY   docusate sodium 100 MG capsule Commonly known as:  COLACE Take 100 mg by mouth every other day.   furosemide 80 MG tablet Commonly known as:  LASIX Take 80 mg by mouth daily before breakfast.   hydrALAZINE 10 MG tablet Commonly known as:  APRESOLINE TAKE ONE TABLET BY MOUTH TWICE DAILY.   LEVEMIR FLEXTOUCH 100 UNIT/ML Pen Generic drug:  Insulin Detemir INJECT 70 UNITS SUBCUTANEOUSLY  DAILY. What changed:  See the new instructions.   LITETOUCH PEN NEEDLES 31G X 8 MM Misc Generic drug:  Insulin Pen Needle USE AS DIRECTED FOUR TIMES DAILY   losartan 50 MG tablet Commonly known as:  COZAAR Take 50 mg by mouth every morning.   nitroGLYCERIN 0.4 MG SL tablet Commonly known as:  NITROSTAT Place 0.4 mg under the tongue as needed for chest pain.   NOVOLOG FLEXPEN 100 UNIT/ML FlexPen Generic drug:  insulin aspart INJECT 10-16 UNITS INTO THE SKIN THREE TIMES DAILY WITH MEALS What changed:  See the new instructions.   ONE TOUCH ULTRA TEST test strip Generic drug:  glucose blood USE TO CHECK BLOOD SUGAR FOUR TIMES DAILY.   Potassium Chloride ER 20 MEQ Tbcr Take 1 tablet by mouth daily.   ranitidine 300 MG tablet Commonly known as:  ZANTAC Take 1 tablet by mouth daily.   sulfamethoxazole-trimethoprim 800-160 MG tablet Commonly known as:  BACTRIM DS,SEPTRA DS Take 1 tablet by mouth 2 (two) times daily.       Diagnostic Studies: Ct Renal Stone Study  Result Date: 09/11/2016 CLINICAL DATA:  Hematuria EXAM: CT ABDOMEN AND PELVIS WITHOUT CONTRAST TECHNIQUE: Multidetector CT imaging of the abdomen and pelvis was performed following the standard protocol without IV contrast. COMPARISON:  06/02/2016, 03/26/2013 FINDINGS: Lower chest: No acute abnormality. Hepatobiliary: No focal liver abnormality is seen. Status post cholecystectomy. No biliary dilatation. Pancreas: Unremarkable. No pancreatic ductal dilatation or surrounding inflammatory changes. Spleen: Normal in size without focal abnormality. Adrenals/Urinary Tract: Adrenal glands within normal limits. Nonspecific perinephric fat stranding. No hydronephrosis or intrarenal calculi. No ureteral stones. Minimal thick-walled appearance of bladder. Lobulated increased density within the posterior aspect of the bladder measuring approximately 3 by 2.6 cm. Stomach/Bowel: Stomach is within normal limits. Appendix appears normal. No  evidence of bowel wall thickening, distention, or inflammatory changes. Vascular/Lymphatic: Aortic atherosclerosis. No enlarged abdominal or pelvic lymph nodes. Reproductive: Calcification at the prostate bed. Surgical clips also present. Other: Fat in the inguinal canals.  No free air or free fluid. Musculoskeletal: Status post surgical pinning of the proximal femurs. Stable sclerotic lesion in the left acetabulum. IMPRESSION: 1. Negative for nephrolithiasis, hydronephrosis or ureteral stone. 2. 3 cm lobulated density within the posterior bladder, this may represent a blood clot or a bladder mass. Consider cystoscopy for further evaluation. Electronically Signed   By: Donavan Foil M.D.   On: 09/11/2016 02:25    Disposition: 01-Home or Self Care    Follow-up Information    Malka So, MD Follow up.   Specialty:  Urology Why:  Please call the office to make an appt with me for 3-4 weeks from now.  Contact  information: [address] [place] [address] [phone]            Signed: BRANDIS, MCGAHEE 09/12/2016, 9:49 AM

## 2016-09-12 NOTE — Progress Notes (Signed)
1 Day Post-Op  Subjective: Urine clear.   He is without complaints.  ROS:  ROS  Anti-infectives: Anti-infectives    Start     Dose/Rate Route Frequency Ordered Stop   09/11/16 2200  cefTRIAXone (ROCEPHIN) 2 g in dextrose 5 % 50 mL IVPB     2 g 100 mL/hr over 30 Minutes Intravenous Every 24 hours 09/11/16 0846     09/11/16 0345  cefTRIAXone (ROCEPHIN) 2 g in dextrose 5 % 50 mL IVPB     2 g 100 mL/hr over 30 Minutes Intravenous  Once 09/11/16 [id number] 09/11/16 0419      Current Facility-Administered Medications  Medication Dose Route Frequency Provider Last Rate Last Dose  . 0.9 %  sodium chloride infusion   Intravenous Continuous Alexis Frock, MD 50 mL/hr at 09/11/16 [phone]    . amLODipine (NORVASC) tablet 10 mg  10 mg Oral QAC breakfast Alexis Frock, MD   10 mg at 09/11/16 0847  . atorvastatin (LIPITOR) tablet 40 mg  40 mg Oral q1800 Alexis Frock, MD   40 mg at 09/11/16 1747  . cefTRIAXone (ROCEPHIN) 2 g in dextrose 5 % 50 mL IVPB  2 g Intravenous Q24H Polly Cobia, RPH   2 g at 09/11/16 2232  . famotidine (PEPCID) tablet 20 mg  20 mg Oral Daily Alexis Frock, MD   20 mg at 09/11/16 0847  . furosemide (LASIX) tablet 80 mg  80 mg Oral QAC breakfast Alexis Frock, MD   80 mg at 09/11/16 0847  . hydrALAZINE (APRESOLINE) tablet 10 mg  10 mg Oral BID Alexis Frock, MD   10 mg at 09/11/16 2233  . HYDROmorphone (DILAUDID) injection 0.5-1 mg  0.5-1 mg Intravenous Q2H PRN Alexis Frock, MD      . insulin aspart (novoLOG) injection 0-20 Units  0-20 Units Subcutaneous TID WC Alexis Frock, MD   4 Units at 09/11/16 1746  . insulin aspart (novoLOG) injection 0-5 Units  0-5 Units Subcutaneous QHS Alexis Frock, MD      . insulin aspart (novoLOG) injection 4 Units  4 Units Subcutaneous TID WC Alexis Frock, MD      . losartan (COZAAR) tablet 50 mg  50 mg Oral q morning - 10a Alexis Frock, MD   50 mg at 09/11/16 0848  . ondansetron (ZOFRAN) injection 4 mg  4 mg Intravenous Q6H PRN  Alexis Frock, MD   4 mg at 09/11/16 0720  . oxyCODONE (Oxy IR/ROXICODONE) immediate release tablet 5 mg  5 mg Oral Q4H PRN Alexis Frock, MD      . senna-docusate (Senokot-S) tablet 1 tablet  1 tablet Oral BID Alexis Frock, MD   1 tablet at 09/11/16 2233   Facility-Administered Medications Ordered in Other Encounters  Medication Dose Route Frequency Provider Last Rate Last Dose  . 0.9 %  sodium chloride infusion  250 mL Intravenous PRN Irine Seal, MD      . acetaminophen (TYLENOL) tablet 650 mg  650 mg Oral Q4H PRN Irine Seal, MD       Or  . acetaminophen (TYLENOL) suppository 650 mg  650 mg Rectal Q4H PRN Irine Seal, MD      . fentaNYL (SUBLIMAZE) injection 25-50 mcg  25-50 mcg Intravenous Q2H PRN Irine Seal, MD      . oxyCODONE (Oxy IR/ROXICODONE) immediate release tablet 5-10 mg  5-10 mg Oral Q4H PRN Irine Seal, MD      . sodium chloride 0.9 % injection 3 mL  3 mL Intravenous Q12H  Irine Seal, MD      . sodium chloride 0.9 % injection 3 mL  3 mL Intravenous PRN Irine Seal, MD         Objective: Vital signs in last 24 hours: Temp:  [97.6 F (36.4 C)-100.9 F (38.3 C)] 98.7 F (37.1 C) (02/22 0531) Pulse Rate:  [86-99] 90 (02/22 0531) Resp:  [20] 20 (02/22 0531) BP: (112-123)/(51-97) 119/52 (02/22 0531) SpO2:  [96 %-99 %] 99 % (02/22 0531)  Intake/Output from previous day: 02/21 0701 - 02/22 0700 In: 3739.2 [P.O.:120; I.V.:969.2; IV Piggyback:50] Out: 4600 [Urine:4600] Intake/Output this shift: No intake/output data recorded.   Physical Exam  Lab Results:   Recent Labs  09/11/16 0031 09/11/16 0044  WBC 8.7  --   HGB 13.2 13.6  HCT 39.6 40.0  PLT 242  --    BMET  Recent Labs  09/11/16 0044  NA 142  K 4.4  CL 105  GLUCOSE 140*  BUN 35*  CREATININE 1.90*   PT/INR No results for input(s): LABPROT, INR in the last 72 hours. ABG No results for input(s): PHART, HCO3 in the last 72 hours.  Invalid input(s): PCO2, PO2  Studies/Results: Ct Renal Stone  Study  Result Date: 09/11/2016 CLINICAL DATA:  Hematuria EXAM: CT ABDOMEN AND PELVIS WITHOUT CONTRAST TECHNIQUE: Multidetector CT imaging of the abdomen and pelvis was performed following the standard protocol without IV contrast. COMPARISON:  06/02/2016, 03/26/2013 FINDINGS: Lower chest: No acute abnormality. Hepatobiliary: No focal liver abnormality is seen. Status post cholecystectomy. No biliary dilatation. Pancreas: Unremarkable. No pancreatic ductal dilatation or surrounding inflammatory changes. Spleen: Normal in size without focal abnormality. Adrenals/Urinary Tract: Adrenal glands within normal limits. Nonspecific perinephric fat stranding. No hydronephrosis or intrarenal calculi. No ureteral stones. Minimal thick-walled appearance of bladder. Lobulated increased density within the posterior aspect of the bladder measuring approximately 3 by 2.6 cm. Stomach/Bowel: Stomach is within normal limits. Appendix appears normal. No evidence of bowel wall thickening, distention, or inflammatory changes. Vascular/Lymphatic: Aortic atherosclerosis. No enlarged abdominal or pelvic lymph nodes. Reproductive: Calcification at the prostate bed. Surgical clips also present. Other: Fat in the inguinal canals.  No free air or free fluid. Musculoskeletal: Status post surgical pinning of the proximal femurs. Stable sclerotic lesion in the left acetabulum. IMPRESSION: 1. Negative for nephrolithiasis, hydronephrosis or ureteral stone. 2. 3 cm lobulated density within the posterior bladder, this may represent a blood clot or a bladder mass. Consider cystoscopy for further evaluation. Electronically Signed   By: Donavan Foil M.D.   On: 09/11/2016 02:25     Assessment and Plan: 1. Clot retention with radiation cystitis and urethral stricture now will clear urine post fulguration and dilation.   I will remove foley for TOV and hope to d/c later today.       LOS: 1 day    Seth Werner, Seth Werner 09/12/2016 [id number]  ID: Seth Werner, male   DOB: 06-Nov-1955, 61 y.o.   MRN: [id number]

## 2016-09-12 NOTE — Discharge Instructions (Signed)
CYSTOSCOPY HOME CARE INSTRUCTIONS  Activity: Rest for the remainder of the day.  Do not drive or operate equipment today.  You may resume normal activities in one to two days as instructed by your physician.   Meals: Drink plenty of liquids and eat light foods such as gelatin or soup this evening.  You may return to a normal meal plan tomorrow.  Return to Work: You may return to work in one to two days or as instructed by your physician.  Special Instructions / Symptoms: Call your physician if any of these symptoms occur:   -persistent or heavy bleeding  -bleeding which continues after first few urination  -large blood clots that are difficult to pass  -urine stream diminishes or stops completely  -fever equal to or higher than 101 degrees Farenheit.  -cloudy urine with a strong, foul odor  -severe pain  Females should always wipe from front to back after elimination.  You may feel some burning pain when you urinate.  This should disappear with time.  Applying moist heat to the lower abdomen or a hot tub bath may help relieve the pain. \  Your urine culture was positive for e. Coli so I am sending you home with an antibiotic.   Patient Signature:  ________________________________________________________  Nurse's Signature:  ________________________________________________________

## 2016-09-12 NOTE — Progress Notes (Signed)
Urine OP not accurate d/t clip on bag not being closed and unknown amt of urine leaking on the floor.

## 2016-09-13 LAB — URINE CULTURE: Culture: >=100000 — AB

## 2016-09-17 ENCOUNTER — Other Ambulatory Visit: Payer: Self-pay | Admitting: "Endocrinology

## 2016-09-17 DIAGNOSIS — N183 Chronic kidney disease, stage 3 (moderate): Secondary | ICD-10-CM | POA: Diagnosis not present

## 2016-09-17 DIAGNOSIS — E1122 Type 2 diabetes mellitus with diabetic chronic kidney disease: Secondary | ICD-10-CM | POA: Diagnosis not present

## 2016-09-17 LAB — COMPREHENSIVE METABOLIC PANEL
ALBUMIN: 3.3 g/dL — AB (ref 3.6–5.1)
ALT: 57 U/L — ABNORMAL HIGH (ref 9–46)
AST: 39 U/L — ABNORMAL HIGH (ref 10–35)
Alkaline Phosphatase: 80 U/L (ref 40–115)
BUN: 35 mg/dL — ABNORMAL HIGH (ref 7–25)
CALCIUM: 8.6 mg/dL (ref 8.6–10.3)
CHLORIDE: 108 mmol/L (ref 98–110)
CO2: 24 mmol/L (ref 20–31)
Creat: 2.75 mg/dL — ABNORMAL HIGH (ref 0.70–1.25)
Glucose, Bld: 103 mg/dL — ABNORMAL HIGH (ref 65–99)
POTASSIUM: 4.8 mmol/L (ref 3.5–5.3)
SODIUM: 141 mmol/L (ref 135–146)
TOTAL PROTEIN: 5.8 g/dL — AB (ref 6.1–8.1)
Total Bilirubin: 0.3 mg/dL (ref 0.2–1.2)

## 2016-09-18 LAB — HEMOGLOBIN A1C
HEMOGLOBIN A1C: 7 % — AB (ref <5.7)
MEAN PLASMA GLUCOSE: 154 mg/dL

## 2016-09-22 DIAGNOSIS — G4733 Obstructive sleep apnea (adult) (pediatric): Secondary | ICD-10-CM | POA: Diagnosis not present

## 2016-09-24 ENCOUNTER — Encounter: Payer: Self-pay | Admitting: "Endocrinology

## 2016-09-24 ENCOUNTER — Ambulatory Visit (INDEPENDENT_AMBULATORY_CARE_PROVIDER_SITE_OTHER): Payer: PPO | Admitting: "Endocrinology

## 2016-09-24 VITALS — BP 144/77 | HR 76 | Ht 65.0 in | Wt 284.0 lb

## 2016-09-24 DIAGNOSIS — E1122 Type 2 diabetes mellitus with diabetic chronic kidney disease: Secondary | ICD-10-CM | POA: Diagnosis not present

## 2016-09-24 DIAGNOSIS — E782 Mixed hyperlipidemia: Secondary | ICD-10-CM | POA: Diagnosis not present

## 2016-09-24 DIAGNOSIS — I10 Essential (primary) hypertension: Secondary | ICD-10-CM

## 2016-09-24 DIAGNOSIS — N183 Chronic kidney disease, stage 3 unspecified: Secondary | ICD-10-CM

## 2016-09-24 MED ORDER — DULAGLUTIDE 1.5 MG/0.5ML ~~LOC~~ SOAJ: 1.5000 mg | SUBCUTANEOUS | 2 refills | Status: DC

## 2016-09-24 NOTE — Patient Instructions (Signed)
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
## 2016-10-04 ENCOUNTER — Ambulatory Visit (INDEPENDENT_AMBULATORY_CARE_PROVIDER_SITE_OTHER): Payer: PPO | Admitting: Urology

## 2016-10-04 ENCOUNTER — Other Ambulatory Visit (HOSPITAL_COMMUNITY)
Admission: RE | Admit: 2016-10-04 | Discharge: 2016-10-04 | Disposition: A | Discharge status: Home / Self Care | Payer: PPO | Source: Other Acute Inpatient Hospital | Attending: Urology | Admitting: Urology

## 2016-10-04 DIAGNOSIS — Z8546 Personal history of malignant neoplasm of prostate: Secondary | ICD-10-CM

## 2016-10-04 DIAGNOSIS — N35012 Post-traumatic membranous urethral stricture: Secondary | ICD-10-CM

## 2016-10-04 DIAGNOSIS — N3041 Irradiation cystitis with hematuria: Secondary | ICD-10-CM | POA: Insufficient documentation

## 2016-10-04 LAB — URINALYSIS, COMPLETE (UACMP) WITH MICROSCOPIC
Bilirubin Urine: NEGATIVE
Glucose, UA: NEGATIVE mg/dL
Ketones, ur: NEGATIVE mg/dL
Nitrite: NEGATIVE
PH: 6 (ref 5.0–8.0)
SPECIFIC GRAVITY, URINE: 1.01 (ref 1.005–1.030)

## 2016-10-06 LAB — URINE CULTURE: CULTURE: NO GROWTH

## 2016-10-22 ENCOUNTER — Other Ambulatory Visit: Payer: Self-pay | Admitting: Cardiology

## 2016-10-22 ENCOUNTER — Other Ambulatory Visit: Payer: Self-pay | Admitting: "Endocrinology

## 2016-10-23 DIAGNOSIS — G4733 Obstructive sleep apnea (adult) (pediatric): Secondary | ICD-10-CM | POA: Diagnosis not present

## 2016-10-30 DIAGNOSIS — E1151 Type 2 diabetes mellitus with diabetic peripheral angiopathy without gangrene: Secondary | ICD-10-CM | POA: Diagnosis not present

## 2016-10-30 DIAGNOSIS — E1140 Type 2 diabetes mellitus with diabetic neuropathy, unspecified: Secondary | ICD-10-CM | POA: Diagnosis not present

## 2016-10-30 DIAGNOSIS — L600 Ingrowing nail: Secondary | ICD-10-CM | POA: Diagnosis not present

## 2016-10-30 DIAGNOSIS — B351 Tinea unguium: Secondary | ICD-10-CM | POA: Diagnosis not present

## 2016-11-15 DIAGNOSIS — R339 Retention of urine, unspecified: Secondary | ICD-10-CM | POA: Diagnosis not present

## 2016-11-22 DIAGNOSIS — G4733 Obstructive sleep apnea (adult) (pediatric): Secondary | ICD-10-CM | POA: Diagnosis not present

## 2016-11-30 ENCOUNTER — Other Ambulatory Visit: Payer: Self-pay | Admitting: "Endocrinology

## 2016-12-23 ENCOUNTER — Other Ambulatory Visit: Payer: Self-pay

## 2016-12-23 DIAGNOSIS — G4733 Obstructive sleep apnea (adult) (pediatric): Secondary | ICD-10-CM | POA: Diagnosis not present

## 2016-12-23 MED ORDER — INSULIN DETEMIR 100 UNIT/ML FLEXPEN: PEN_INJECTOR | SUBCUTANEOUS | 2 refills | Status: DC

## 2016-12-23 NOTE — Addendum Note (Signed)
Addendum  created 12/23/16 1124 by Myrtie Soman, MD   Sign clinical note

## 2016-12-26 ENCOUNTER — Ambulatory Visit: Payer: PPO | Admitting: "Endocrinology

## 2016-12-27 DIAGNOSIS — I10 Essential (primary) hypertension: Secondary | ICD-10-CM | POA: Diagnosis not present

## 2016-12-27 DIAGNOSIS — N183 Chronic kidney disease, stage 3 (moderate): Secondary | ICD-10-CM | POA: Diagnosis not present

## 2016-12-27 DIAGNOSIS — E1122 Type 2 diabetes mellitus with diabetic chronic kidney disease: Secondary | ICD-10-CM | POA: Diagnosis not present

## 2016-12-30 ENCOUNTER — Other Ambulatory Visit: Payer: Self-pay | Admitting: "Endocrinology

## 2016-12-30 DIAGNOSIS — N183 Chronic kidney disease, stage 3 (moderate): Secondary | ICD-10-CM | POA: Diagnosis not present

## 2016-12-30 DIAGNOSIS — E1122 Type 2 diabetes mellitus with diabetic chronic kidney disease: Secondary | ICD-10-CM | POA: Diagnosis not present

## 2016-12-30 LAB — COMPREHENSIVE METABOLIC PANEL
ALT: 17 U/L (ref 9–46)
AST: 20 U/L (ref 10–35)
Albumin: 3.5 g/dL — ABNORMAL LOW (ref 3.6–5.1)
Alkaline Phosphatase: 69 U/L (ref 40–115)
BUN: 16 mg/dL (ref 7–25)
CHLORIDE: 108 mmol/L (ref 98–110)
CO2: 24 mmol/L (ref 20–31)
CREATININE: 1.78 mg/dL — AB (ref 0.70–1.25)
Calcium: 8.8 mg/dL (ref 8.6–10.3)
Glucose, Bld: 86 mg/dL (ref 65–99)
Potassium: 3.8 mmol/L (ref 3.5–5.3)
SODIUM: 143 mmol/L (ref 135–146)
Total Bilirubin: 0.4 mg/dL (ref 0.2–1.2)
Total Protein: 6.5 g/dL (ref 6.1–8.1)

## 2016-12-31 LAB — HEMOGLOBIN A1C
Hgb A1c MFr Bld: 6.3 % — ABNORMAL HIGH (ref <5.7)
Mean Plasma Glucose: 134 mg/dL

## 2017-01-06 ENCOUNTER — Ambulatory Visit (INDEPENDENT_AMBULATORY_CARE_PROVIDER_SITE_OTHER): Payer: PPO | Admitting: "Endocrinology

## 2017-01-06 ENCOUNTER — Encounter: Payer: Self-pay | Admitting: "Endocrinology

## 2017-01-06 VITALS — BP 119/75 | HR 89 | Ht 65.0 in | Wt 271.0 lb

## 2017-01-06 DIAGNOSIS — N183 Chronic kidney disease, stage 3 unspecified: Secondary | ICD-10-CM

## 2017-01-06 DIAGNOSIS — E782 Mixed hyperlipidemia: Secondary | ICD-10-CM | POA: Diagnosis not present

## 2017-01-06 DIAGNOSIS — E1122 Type 2 diabetes mellitus with diabetic chronic kidney disease: Secondary | ICD-10-CM | POA: Diagnosis not present

## 2017-01-06 DIAGNOSIS — I10 Essential (primary) hypertension: Secondary | ICD-10-CM | POA: Diagnosis not present

## 2017-01-06 NOTE — Patient Instructions (Signed)
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
## 2017-01-15 DIAGNOSIS — L600 Ingrowing nail: Secondary | ICD-10-CM | POA: Diagnosis not present

## 2017-01-15 DIAGNOSIS — B351 Tinea unguium: Secondary | ICD-10-CM | POA: Diagnosis not present

## 2017-01-15 DIAGNOSIS — E1151 Type 2 diabetes mellitus with diabetic peripheral angiopathy without gangrene: Secondary | ICD-10-CM | POA: Diagnosis not present

## 2017-01-15 DIAGNOSIS — E1140 Type 2 diabetes mellitus with diabetic neuropathy, unspecified: Secondary | ICD-10-CM | POA: Diagnosis not present

## 2017-01-22 DIAGNOSIS — G4733 Obstructive sleep apnea (adult) (pediatric): Secondary | ICD-10-CM | POA: Diagnosis not present

## 2017-02-19 ENCOUNTER — Other Ambulatory Visit: Payer: Self-pay | Admitting: "Endocrinology

## 2017-02-22 DIAGNOSIS — G4733 Obstructive sleep apnea (adult) (pediatric): Secondary | ICD-10-CM | POA: Diagnosis not present

## 2017-03-07 DIAGNOSIS — R339 Retention of urine, unspecified: Secondary | ICD-10-CM | POA: Diagnosis not present

## 2017-03-20 ENCOUNTER — Encounter: Payer: Self-pay | Admitting: *Deleted

## 2017-03-21 ENCOUNTER — Encounter: Payer: Self-pay | Admitting: Cardiology

## 2017-03-21 ENCOUNTER — Ambulatory Visit (INDEPENDENT_AMBULATORY_CARE_PROVIDER_SITE_OTHER): Payer: PPO | Admitting: Cardiology

## 2017-03-21 VITALS — BP 107/70 | HR 80 | Ht 65.0 in | Wt 275.0 lb

## 2017-03-21 DIAGNOSIS — E782 Mixed hyperlipidemia: Secondary | ICD-10-CM

## 2017-03-21 DIAGNOSIS — R0789 Other chest pain: Secondary | ICD-10-CM | POA: Diagnosis not present

## 2017-03-21 DIAGNOSIS — Z8546 Personal history of malignant neoplasm of prostate: Secondary | ICD-10-CM | POA: Diagnosis not present

## 2017-03-21 DIAGNOSIS — I10 Essential (primary) hypertension: Secondary | ICD-10-CM | POA: Diagnosis not present
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
## 2017-03-21 NOTE — Patient Instructions (Addendum)

## 2017-03-28 ENCOUNTER — Ambulatory Visit (INDEPENDENT_AMBULATORY_CARE_PROVIDER_SITE_OTHER): Payer: PPO | Admitting: Urology

## 2017-03-28 DIAGNOSIS — N35011 Post-traumatic bulbous urethral stricture: Secondary | ICD-10-CM | POA: Diagnosis not present

## 2017-03-28 DIAGNOSIS — C61 Malignant neoplasm of prostate: Secondary | ICD-10-CM | POA: Diagnosis not present

## 2017-04-04 DIAGNOSIS — E1122 Type 2 diabetes mellitus with diabetic chronic kidney disease: Secondary | ICD-10-CM | POA: Diagnosis not present

## 2017-04-04 DIAGNOSIS — N183 Chronic kidney disease, stage 3 (moderate): Secondary | ICD-10-CM | POA: Diagnosis not present

## 2017-04-05 LAB — RENAL FUNCTION PANEL
Albumin: 3.6 g/dL (ref 3.6–5.1)
BUN/Creatinine Ratio: 10 (calc) (ref 6–22)
BUN: 20 mg/dL (ref 7–25)
CHLORIDE: 108 mmol/L (ref 98–110)
CO2: 29 mmol/L (ref 20–32)
CREATININE: 2.02 mg/dL — AB (ref 0.70–1.25)
Calcium: 8.9 mg/dL (ref 8.6–10.3)
GLUCOSE: 66 mg/dL (ref 65–99)
POTASSIUM: 4.1 mmol/L (ref 3.5–5.3)
Phosphorus: 4 mg/dL (ref 2.5–4.5)
Sodium: 144 mmol/L (ref 135–146)

## 2017-04-05 LAB — HEMOGLOBIN A1C
EAG (MMOL/L): 7.4 (calc)
Hgb A1c MFr Bld: 6.3 % of total Hgb — ABNORMAL HIGH (ref <5.7)
Mean Plasma Glucose: 134 (calc)

## 2017-04-10 ENCOUNTER — Ambulatory Visit (INDEPENDENT_AMBULATORY_CARE_PROVIDER_SITE_OTHER): Payer: PPO | Admitting: "Endocrinology

## 2017-04-10 ENCOUNTER — Encounter: Payer: Self-pay | Admitting: "Endocrinology

## 2017-04-10 VITALS — BP 124/74 | HR 75 | Ht 65.0 in | Wt 270.0 lb

## 2017-04-10 DIAGNOSIS — N183 Chronic kidney disease, stage 3 unspecified: Secondary | ICD-10-CM

## 2017-04-10 DIAGNOSIS — E1122 Type 2 diabetes mellitus with diabetic chronic kidney disease: Secondary | ICD-10-CM | POA: Diagnosis not present

## 2017-04-10 DIAGNOSIS — I10 Essential (primary) hypertension: Secondary | ICD-10-CM

## 2017-04-10 DIAGNOSIS — E782 Mixed hyperlipidemia: Secondary | ICD-10-CM | POA: Diagnosis not present

## 2017-04-10 MED ORDER — INSULIN DETEMIR 100 UNIT/ML FLEXPEN: PEN_INJECTOR | SUBCUTANEOUS | 2 refills | Status: DC
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
## 2017-04-10 NOTE — Patient Instructions (Signed)

## 2017-04-23 DIAGNOSIS — L600 Ingrowing nail: Secondary | ICD-10-CM | POA: Diagnosis not present

## 2017-04-23 DIAGNOSIS — E1140 Type 2 diabetes mellitus with diabetic neuropathy, unspecified: Secondary | ICD-10-CM | POA: Diagnosis not present

## 2017-04-23 DIAGNOSIS — B351 Tinea unguium: Secondary | ICD-10-CM | POA: Diagnosis not present

## 2017-04-23 DIAGNOSIS — E1151 Type 2 diabetes mellitus with diabetic peripheral angiopathy without gangrene: Secondary | ICD-10-CM | POA: Diagnosis not present

## 2017-04-25 ENCOUNTER — Other Ambulatory Visit: Payer: Self-pay | Admitting: "Endocrinology

## 2017-05-13 ENCOUNTER — Other Ambulatory Visit: Payer: Self-pay | Admitting: "Endocrinology

## 2017-05-15 DIAGNOSIS — R339 Retention of urine, unspecified: Secondary | ICD-10-CM | POA: Diagnosis not present

## 2017-05-16 DIAGNOSIS — Z23 Encounter for immunization: Secondary | ICD-10-CM | POA: Diagnosis not present

## 2017-06-27 DIAGNOSIS — E669 Obesity, unspecified: Secondary | ICD-10-CM | POA: Diagnosis not present

## 2017-06-27 DIAGNOSIS — Z794 Long term (current) use of insulin: Secondary | ICD-10-CM | POA: Diagnosis not present

## 2017-06-27 DIAGNOSIS — I10 Essential (primary) hypertension: Secondary | ICD-10-CM | POA: Diagnosis not present

## 2017-06-27 DIAGNOSIS — E118 Type 2 diabetes mellitus with unspecified complications: Secondary | ICD-10-CM | POA: Diagnosis not present

## 2017-07-03 DIAGNOSIS — N183 Chronic kidney disease, stage 3 (moderate): Secondary | ICD-10-CM | POA: Diagnosis not present

## 2017-07-03 DIAGNOSIS — E1122 Type 2 diabetes mellitus with diabetic chronic kidney disease: Secondary | ICD-10-CM | POA: Diagnosis not present

## 2017-07-04 LAB — HEMOGLOBIN A1C
EAG (MMOL/L): 8.1 (calc)
HEMOGLOBIN A1C: 6.7 %{Hb} — AB (ref <5.7)
Mean Plasma Glucose: 146 (calc)

## 2017-07-04 LAB — COMPLETE METABOLIC PANEL WITH GFR
AG RATIO: 1.7 (calc) (ref 1.0–2.5)
ALBUMIN MSPROF: 3.8 g/dL (ref 3.6–5.1)
ALKALINE PHOSPHATASE (APISO): 67 U/L (ref 40–115)
ALT: 17 U/L (ref 9–46)
AST: 20 U/L (ref 10–35)
BUN / CREAT RATIO: 10 (calc) (ref 6–22)
BUN: 19 mg/dL (ref 7–25)
CALCIUM: 8.9 mg/dL (ref 8.6–10.3)
CO2: 31 mmol/L (ref 20–32)
CREATININE: 1.99 mg/dL — AB (ref 0.70–1.25)
Chloride: 107 mmol/L (ref 98–110)
GFR, EST NON AFRICAN AMERICAN: 35 mL/min/{1.73_m2} — AB (ref >=60)
GFR, Est African American: 40 mL/min/{1.73_m2} — ABNORMAL LOW (ref >=60)
GLOBULIN: 2.3 g/dL (ref 1.9–3.7)
Glucose, Bld: 98 mg/dL (ref 65–99)
POTASSIUM: 4.3 mmol/L (ref 3.5–5.3)
SODIUM: 143 mmol/L (ref 135–146)
Total Bilirubin: 0.5 mg/dL (ref 0.2–1.2)
Total Protein: 6.1 g/dL (ref 6.1–8.1)

## 2017-07-09 DIAGNOSIS — B351 Tinea unguium: Secondary | ICD-10-CM | POA: Diagnosis not present

## 2017-07-09 DIAGNOSIS — E1140 Type 2 diabetes mellitus with diabetic neuropathy, unspecified: Secondary | ICD-10-CM | POA: Diagnosis not present

## 2017-07-09 DIAGNOSIS — E1151 Type 2 diabetes mellitus with diabetic peripheral angiopathy without gangrene: Secondary | ICD-10-CM | POA: Diagnosis not present

## 2017-07-09 DIAGNOSIS — L600 Ingrowing nail: Secondary | ICD-10-CM | POA: Diagnosis not present

## 2017-07-10 ENCOUNTER — Ambulatory Visit: Payer: PPO | Admitting: "Endocrinology

## 2017-07-10 ENCOUNTER — Encounter: Payer: Self-pay | Admitting: "Endocrinology

## 2017-07-10 VITALS — BP 130/73 | HR 80 | Ht 65.0 in | Wt 272.0 lb

## 2017-07-10 DIAGNOSIS — N183 Chronic kidney disease, stage 3 unspecified: Secondary | ICD-10-CM

## 2017-07-10 DIAGNOSIS — E1122 Type 2 diabetes mellitus with diabetic chronic kidney disease: Secondary | ICD-10-CM | POA: Diagnosis not present

## 2017-07-10 DIAGNOSIS — I10 Essential (primary) hypertension: Secondary | ICD-10-CM | POA: Diagnosis not present

## 2017-07-10 DIAGNOSIS — E782 Mixed hyperlipidemia: Secondary | ICD-10-CM | POA: Diagnosis not present

## 2017-07-10 NOTE — Patient Instructions (Signed)
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
## 2017-07-19 ENCOUNTER — Other Ambulatory Visit: Payer: Self-pay | Admitting: "Endocrinology

## 2017-08-07 ENCOUNTER — Other Ambulatory Visit: Payer: Self-pay | Admitting: "Endocrinology

## 2017-08-14 DIAGNOSIS — Z7984 Long term (current) use of oral hypoglycemic drugs: Secondary | ICD-10-CM | POA: Diagnosis not present

## 2017-08-14 DIAGNOSIS — I10 Essential (primary) hypertension: Secondary | ICD-10-CM | POA: Diagnosis not present

## 2017-08-14 DIAGNOSIS — D2310 Other benign neoplasm of skin of unspecified eyelid, including canthus: Secondary | ICD-10-CM | POA: Diagnosis not present

## 2017-08-14 DIAGNOSIS — H11133 Conjunctival pigmentations, bilateral: Secondary | ICD-10-CM | POA: Diagnosis not present

## 2017-08-14 DIAGNOSIS — H524 Presbyopia: Secondary | ICD-10-CM | POA: Diagnosis not present

## 2017-08-14 DIAGNOSIS — H18413 Arcus senilis, bilateral: Secondary | ICD-10-CM | POA: Diagnosis not present

## 2017-08-14 DIAGNOSIS — H3562 Retinal hemorrhage, left eye: Secondary | ICD-10-CM | POA: Diagnosis not present

## 2017-08-14 DIAGNOSIS — E113591 Type 2 diabetes mellitus with proliferative diabetic retinopathy without macular edema, right eye: Secondary | ICD-10-CM | POA: Diagnosis not present

## 2017-08-14 DIAGNOSIS — E113593 Type 2 diabetes mellitus with proliferative diabetic retinopathy without macular edema, bilateral: Secondary | ICD-10-CM | POA: Diagnosis not present

## 2017-08-14 DIAGNOSIS — H11153 Pinguecula, bilateral: Secondary | ICD-10-CM | POA: Diagnosis not present

## 2017-08-14 DIAGNOSIS — H35033 Hypertensive retinopathy, bilateral: Secondary | ICD-10-CM | POA: Diagnosis not present

## 2017-08-14 DIAGNOSIS — H35031 Hypertensive retinopathy, right eye: Secondary | ICD-10-CM | POA: Diagnosis not present

## 2017-08-27 DIAGNOSIS — R339 Retention of urine, unspecified: Secondary | ICD-10-CM | POA: Diagnosis not present

## 2017-09-10 DIAGNOSIS — E083593 Diabetes mellitus due to underlying condition with proliferative diabetic retinopathy without macular edema, bilateral: Secondary | ICD-10-CM | POA: Diagnosis not present

## 2017-09-10 DIAGNOSIS — H2513 Age-related nuclear cataract, bilateral: Secondary | ICD-10-CM | POA: Diagnosis not present

## 2017-09-10 DIAGNOSIS — H52223 Regular astigmatism, bilateral: Secondary | ICD-10-CM | POA: Diagnosis not present

## 2017-09-10 DIAGNOSIS — H18413 Arcus senilis, bilateral: Secondary | ICD-10-CM | POA: Diagnosis not present

## 2017-09-10 DIAGNOSIS — H524 Presbyopia: Secondary | ICD-10-CM | POA: Diagnosis not present

## 2017-09-10 DIAGNOSIS — H25813 Combined forms of age-related cataract, bilateral: Secondary | ICD-10-CM | POA: Diagnosis not present

## 2017-09-10 DIAGNOSIS — H5203 Hypermetropia, bilateral: Secondary | ICD-10-CM | POA: Diagnosis not present

## 2017-09-10 DIAGNOSIS — H11153 Pinguecula, bilateral: Secondary | ICD-10-CM | POA: Diagnosis not present

## 2017-09-10 DIAGNOSIS — H25013 Cortical age-related cataract, bilateral: Secondary | ICD-10-CM | POA: Diagnosis not present

## 2017-09-10 DIAGNOSIS — H11133 Conjunctival pigmentations, bilateral: Secondary | ICD-10-CM | POA: Diagnosis not present

## 2017-09-10 DIAGNOSIS — D2310 Other benign neoplasm of skin of unspecified eyelid, including canthus: Secondary | ICD-10-CM | POA: Diagnosis not present

## 2017-09-11 ENCOUNTER — Other Ambulatory Visit: Payer: Self-pay | Admitting: "Endocrinology

## 2017-09-19 DIAGNOSIS — E113591 Type 2 diabetes mellitus with proliferative diabetic retinopathy without macular edema, right eye: Secondary | ICD-10-CM | POA: Diagnosis not present

## 2017-09-19 DIAGNOSIS — H2513 Age-related nuclear cataract, bilateral: Secondary | ICD-10-CM | POA: Diagnosis not present

## 2017-09-19 DIAGNOSIS — E113512 Type 2 diabetes mellitus with proliferative diabetic retinopathy with macular edema, left eye: Secondary | ICD-10-CM | POA: Diagnosis not present

## 2017-10-13 DIAGNOSIS — R339 Retention of urine, unspecified: Secondary | ICD-10-CM | POA: Diagnosis not present

## 2017-10-21 DIAGNOSIS — E113512 Type 2 diabetes mellitus with proliferative diabetic retinopathy with macular edema, left eye: Secondary | ICD-10-CM | POA: Diagnosis not present

## 2017-10-21 DIAGNOSIS — E113591 Type 2 diabetes mellitus with proliferative diabetic retinopathy without macular edema, right eye: Secondary | ICD-10-CM | POA: Diagnosis not present

## 2017-10-25 ENCOUNTER — Other Ambulatory Visit: Payer: Self-pay | Admitting: "Endocrinology

## 2017-11-05 DIAGNOSIS — N183 Chronic kidney disease, stage 3 (moderate): Secondary | ICD-10-CM | POA: Diagnosis not present

## 2017-11-05 DIAGNOSIS — E1122 Type 2 diabetes mellitus with diabetic chronic kidney disease: Secondary | ICD-10-CM | POA: Diagnosis not present

## 2017-11-06 LAB — COMPLETE METABOLIC PANEL WITH GFR
AG Ratio: 1.7 (calc) (ref 1.0–2.5)
ALBUMIN MSPROF: 3.9 g/dL (ref 3.6–5.1)
ALT: 19 U/L (ref 9–46)
AST: 20 U/L (ref 10–35)
Alkaline phosphatase (APISO): 88 U/L (ref 40–115)
BUN / CREAT RATIO: 10 (calc) (ref 6–22)
BUN: 20 mg/dL (ref 7–25)
CALCIUM: 8.9 mg/dL (ref 8.6–10.3)
CO2: 30 mmol/L (ref 20–32)
Chloride: 107 mmol/L (ref 98–110)
Creat: 2.01 mg/dL — ABNORMAL HIGH (ref 0.70–1.25)
GFR, EST NON AFRICAN AMERICAN: 34 mL/min/{1.73_m2} — AB (ref >=60)
GFR, Est African American: 40 mL/min/{1.73_m2} — ABNORMAL LOW (ref >=60)
GLUCOSE: 93 mg/dL (ref 65–99)
Globulin: 2.3 g/dL (calc) (ref 1.9–3.7)
Potassium: 4.7 mmol/L (ref 3.5–5.3)
Sodium: 142 mmol/L (ref 135–146)
TOTAL PROTEIN: 6.2 g/dL (ref 6.1–8.1)
Total Bilirubin: 0.6 mg/dL (ref 0.2–1.2)

## 2017-11-06 LAB — HEMOGLOBIN A1C
Hgb A1c MFr Bld: 7.1 % of total Hgb — ABNORMAL HIGH (ref <5.7)
Mean Plasma Glucose: 157 (calc)
eAG (mmol/L): 8.7 (calc)

## 2017-11-12 ENCOUNTER — Encounter: Payer: Self-pay | Admitting: "Endocrinology

## 2017-11-12 ENCOUNTER — Ambulatory Visit (INDEPENDENT_AMBULATORY_CARE_PROVIDER_SITE_OTHER): Payer: PPO | Admitting: "Endocrinology

## 2017-11-12 VITALS — BP 118/71 | HR 78 | Ht 65.0 in | Wt 276.0 lb

## 2017-11-12 DIAGNOSIS — N183 Chronic kidney disease, stage 3 unspecified: Secondary | ICD-10-CM

## 2017-11-12 DIAGNOSIS — E782 Mixed hyperlipidemia: Secondary | ICD-10-CM

## 2017-11-12 DIAGNOSIS — I10 Essential (primary) hypertension: Secondary | ICD-10-CM

## 2017-11-12 DIAGNOSIS — E1122 Type 2 diabetes mellitus with diabetic chronic kidney disease: Secondary | ICD-10-CM | POA: Diagnosis not present

## 2017-11-12 MED ORDER — INSULIN DETEMIR 100 UNIT/ML FLEXPEN: PEN_INJECTOR | SUBCUTANEOUS | 2 refills | Status: DC
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
## 2017-11-12 NOTE — Patient Instructions (Signed)

## 2017-11-18 DIAGNOSIS — E113512 Type 2 diabetes mellitus with proliferative diabetic retinopathy with macular edema, left eye: Secondary | ICD-10-CM | POA: Diagnosis not present

## 2017-12-03 DIAGNOSIS — R339 Retention of urine, unspecified: Secondary | ICD-10-CM | POA: Diagnosis not present

## 2017-12-11 DIAGNOSIS — Z8546 Personal history of malignant neoplasm of prostate: Secondary | ICD-10-CM | POA: Diagnosis not present

## 2017-12-11 DIAGNOSIS — N35011 Post-traumatic bulbous urethral stricture: Secondary | ICD-10-CM | POA: Diagnosis not present

## 2017-12-11 DIAGNOSIS — R310 Gross hematuria: Secondary | ICD-10-CM | POA: Diagnosis not present

## 2017-12-18 ENCOUNTER — Other Ambulatory Visit: Payer: Self-pay | Admitting: Urology

## 2017-12-19 ENCOUNTER — Other Ambulatory Visit: Payer: Self-pay

## 2017-12-19 ENCOUNTER — Encounter (HOSPITAL_BASED_OUTPATIENT_CLINIC_OR_DEPARTMENT_OTHER): Payer: Self-pay | Admitting: Emergency Medicine

## 2017-12-19 NOTE — Progress Notes (Addendum)
SPOKE WITH: patient  RIDING HOME WITH: son Ajamu Maxon [phone] AM MEDICATIONS: amlodipine, carvedilol, hydralazaine, losartan,  NPO STATUS: npo after mn, sips of water with meds LABS:  ekg current in epic , needs ISTAT8 day of surgery  COMMENTS/CONCERNS: sleep apnea, instructed  bring mask and tubing

## 2017-12-22 NOTE — H&P (Signed)
CC: I have a urethral stricture.  HPI: Seth Werner is a 62 year-old male established patient who is here for a urethral stricture.  He does have a history of urethral strictures. He does not have to strain or bear down to start his urinary stream. He does not have a split stream when he urinates. He does have a good size and strength to his urinary stream. His urinary stream does not start and stop during voiding. He does not have frequency. He is not having problems with emptying his bladder well. He has had the symptoms for 8 years.   He does not have a previous history of sexually transmitted diseases. He has not had a history of trauma to the urethra. He has received radiation therapy. His symptoms have not gotten worse over the last year.   He has not previously had an indwelling catheter in for more than two weeks at a time.   Seth Werner returns today in f/u. He has a history of a post radiation stricture and had clot retention that required cysto, clot evac and fulguration on 09/12/16 by Dr. Tresa Moore. He has stopped CIC before his last visit. He has been passing some "skin tissue" and blood over the past month. His wife cathed him yesterday and dislodged some tissue and he voided but he still feels like something is trying to come through. It isn't is bad as before. The catheter is hard to get by the bladder neck area. He is 46yrs post radiation for prostate cancer. His PSA was 0.3 in 8/18 which was stable.       CC: I have prostate cancer.  HPI: His prostate cancer was diagnosed approximately 03/22/2006. His cancer was diagnosed by Dr. Michela Pitcher. His most recent PSA is 0.3.   He has not undergone surgery for treatment. He has undergone External Beam Radiation Therapy for treatment. He has not undergone Hormonal Therapy for treatment.   He does not have urinary incontinence. He does have problems with erectile dysfunction. He has not recently had unwanted weight loss. He is not having pain in new  locations.   03/28/2017: Hx of prostate cancer treated with IMRT over 10 years ago. PSA 0.3 and stable in 8/18     CC: AUA Questions Scoring.  HPI:     AUA Symptom Score: Less than 20% of the time he has the sensation of not emptying his bladder completely when finished urinating. Less than 50% of the time he has to urinate again fewer than two hours after he has finished urinating. He does not have to stop and start again several times when he urinates. Almost always he finds it difficult to postpone urination. Less than 50% of the time he has a weak urinary stream. Less than 20% of the time he has to push or strain to begin urination. He has to get up to urinate 1 time from the time he goes to bed until the time he gets up in the morning.   Calculated AUA Symptom Score: 12    QOL Score: He would feel pleased if he had to live with his urinary condition the way it is now for the rest of his life.   Calculated QOL Symptom Score: 1    ALLERGIES: No Allergies    MEDICATIONS: AmLODIPine Besylate TABS Oral  Aspirin 81 MG TABS Oral  Carvedilol 6.25 MG Oral Tablet Oral  Furosemide 80 MG Oral Tablet Oral  Hydralazine Hcl 10 mg tablet Oral  Insulin  Insulin  Syringe 31 gauge x 5/16" syringe, empty disposable Does Not Apply  Invokana 100 mg tablet Oral  Levemir 100 unit/ml vial Subcutaneous  Losartan Potassium 50 MG Oral Tablet Oral  Nitrostat 0.4 mg tablet, sublingual Sublingual  Pantoprazole Sodium 40 mg tablet, delayed release Oral  Pravastatin Sodium 40 mg tablet Oral  Trulicity 1 PO Daily  Vitamin D TABS Oral     GU PSH: Complex Uroflow - 03/28/2017, 10/04/2016, 03/22/2016 Cysto Dilate Stricture (M or F) - 09/11/2016, 2014, 2013      [place] Notes: Cystoscopy For Urethral Stricture, Cystoscopy For Urethral Stricture, Hand Surgery, Knee Surgery, Hip Surgery  Retinal repair on right about 4/17.  Laser eye surgery in 8/17.   NON-GU PSH: None   GU PMH: Bulbar urethral stricture -  03/28/2017 Prostate Cancer - 03/28/2017, Prostate cancer, - 2016 Membranous urethral stricture, He has an excellent flow despite no CIC for a month. Flowrate in 6 months. - 10/04/2016 Radiation cystitis (with hematuria), The bleeding resolved but he may have a UTI. - 10/04/2016 Gross hematuria, Gross hematuria - 2016, Gross Hematuria, - 2014 Personal Hx Oth male genital organs diseases, History of balanitis - 2016 Urethral Stricture, Unspec, Urethral stricture - 2016 Weak Urinary Stream, Weak urinary stream - 2016 ED due to arterial insufficiency, Erectile dysfunction due to arterial insufficiency - 2014 History of prostate cancer, Prostate Cancer - 2014 Kidney Failure Unspec, Renal Failure - 2014 Other microscopic hematuria, Microscopic Hematuria - 2014, Microscopic Hematuria, - 2014 Phimosis, Phimosis - 2014 Urinary Frequency, Increased urinary frequency - 2014 Urinary Retention, Unspec, Acute Urinary Retention - 2014, Incomplete bladder emptying, - 2014      PMH Notes:  2009-06-12 10:10:31 - Note: Visit For: Exam Following Radiotherapy  2009-03-06 13:00:17 - Note: Arthritis   NON-GU PMH: Rectal polyp, Rectal polyp - 2016 Personal history of other diseases of the circulatory system, History of hypertension - 2014 Personal history of other diseases of the nervous system and sense organs, History of sleep apnea - 2014 Personal history of other endocrine, nutritional and metabolic disease, History of hypercholesterolemia - 2014, History of diabetes mellitus, - 2014 Encounter for general adult medical examination without abnormal findings, Encounter for preventive health examination    FAMILY HISTORY: Death In The Family Father - Mother Death In The Family Mother - Mother Diabetes - Runs In Family Family Health Status Number - Mother Hypertension - Father   SOCIAL HISTORY: None    Notes: Disability, Never A Smoker, Alcohol Use, Marital History - Currently Married, Tobacco Use, Caffeine  Use, Occupation:   REVIEW OF SYSTEMS:    GU Review Male:   Patient denies frequent urination, hard to postpone urination, burning/ pain with urination, get up at night to urinate, leakage of urine, stream starts and stops, trouble starting your stream, have to strain to urinate , erection problems, and penile pain.  Gastrointestinal (Upper):   Patient denies nausea, vomiting, and indigestion/ heartburn.  Gastrointestinal (Lower):   Patient denies diarrhea and constipation.  Constitutional:   Patient denies fever, night sweats, weight loss, and fatigue.  Skin:   Patient denies skin rash/ lesion and itching.  Eyes:   Patient denies blurred vision and double vision.  Ears/ Nose/ Throat:   Patient denies sore throat and sinus problems.  Hematologic/Lymphatic:   Patient denies swollen glands and easy bruising.  Cardiovascular:   Patient reports leg swelling. Patient denies chest pains.  Respiratory:   Patient denies cough and shortness of breath.  Endocrine:   Patient denies excessive  thirst.  Musculoskeletal:   Patient reports back pain. Patient denies joint pain.  Neurological:   Patient denies headaches and dizziness.  Psychologic:   Patient denies depression and anxiety.   VITAL SIGNS:      12/11/2017 12:50 PM  Weight 272 lb / 123.38 kg  Height 65 in / 165.1 cm  BP 153/79 mmHg  Heart Rate 69 /min  Temperature 98.4 F / 36.8 C  BMI 45.3 kg/m   MULTI-SYSTEM PHYSICAL EXAMINATION:    Constitutional: Well-nourished. No physical deformities. Normally developed. Good grooming.  Respiratory: No labored breathing, no use of accessory muscles. Normal breath sounds.  Cardiovascular: Regular rate and rhythm. No murmur, no gallop.     PAST DATA REVIEWED:  Source Of History:  Patient  Records Review:   AUA Symptom Score  Urine Test Review:   Urinalysis  Urodynamics Review:   Review Bladder Scan   03/21/17 03/22/16 12/26/14 12/03/13 06/05/13 11/07/11 10/11/10 03/09/10  PSA  Total PSA 0.3  ng/dl 0.3 ng/dl 0.34  0.33  0.32  0.24  0.29  0.52     PROCEDURES:           PVR Ultrasound - 51798  Scanned Volume: 76 cc         Urinalysis w/Scope Dipstick Dipstick Cont'd Micro  Color: Amber Bilirubin: Neg WBC/hpf: 0 - 5/hpf  Appearance: Cloudy Ketones: Neg RBC/hpf: >60/hpf  Specific Gravity: 1.015 Blood: 3+ Bacteria: NS (Not Seen)  pH: 6.0 Protein: 1+ Cystals: NS (Not Seen)  Glucose: Neg Urobilinogen: 0.2 Casts: NS (Not Seen)    Nitrites: Neg Trichomonas: Not Present    Leukocyte Esterase: 1+ Mucous: Present      Epithelial Cells: 0 - 5/hpf      Yeast: NS (Not Seen)      Sperm: Not Present    ASSESSMENT:      ICD-10 Details  1 GU:   Bulbar urethral stricture - N35.011 Worsening - He has had some progressive voiding symptoms with hematuria. he is emptying ok. I am going to get him set up for cystoscopy with urethral dilation and bilateral RTG's to evaluate the hematuria. I have reviewed the risks.   2   Gross hematuria - R31.0 Worsening - Urine culture.   3   History of prostate cancer - Z85.46 His PSA in 8/18 was stable.    PLAN:           Orders Labs Urine Culture          Schedule Return Visit/Planned Activity: ASAP - Schedule Surgery

## 2017-12-23 ENCOUNTER — Ambulatory Visit (HOSPITAL_BASED_OUTPATIENT_CLINIC_OR_DEPARTMENT_OTHER): Payer: PPO | Admitting: Anesthesiology

## 2017-12-23 ENCOUNTER — Encounter (HOSPITAL_BASED_OUTPATIENT_CLINIC_OR_DEPARTMENT_OTHER)
Admission: RE | Disposition: A | Discharge status: Home / Self Care | Payer: Self-pay | Source: Ambulatory Visit | Attending: Urology

## 2017-12-23 ENCOUNTER — Ambulatory Visit (HOSPITAL_BASED_OUTPATIENT_CLINIC_OR_DEPARTMENT_OTHER)
Admission: RE | Admit: 2017-12-23 | Discharge: 2017-12-23 | Disposition: A | Discharge status: Home / Self Care | Payer: PPO | Source: Ambulatory Visit | Attending: Urology | Admitting: Urology

## 2017-12-23 ENCOUNTER — Encounter (HOSPITAL_BASED_OUTPATIENT_CLINIC_OR_DEPARTMENT_OTHER): Payer: Self-pay

## 2017-12-23 DIAGNOSIS — Z794 Long term (current) use of insulin: Secondary | ICD-10-CM | POA: Insufficient documentation

## 2017-12-23 DIAGNOSIS — K219 Gastro-esophageal reflux disease without esophagitis: Secondary | ICD-10-CM | POA: Insufficient documentation

## 2017-12-23 DIAGNOSIS — N210 Calculus in bladder: Secondary | ICD-10-CM | POA: Insufficient documentation

## 2017-12-23 DIAGNOSIS — E119 Type 2 diabetes mellitus without complications: Secondary | ICD-10-CM | POA: Insufficient documentation

## 2017-12-23 DIAGNOSIS — I129 Hypertensive chronic kidney disease with stage 1 through stage 4 chronic kidney disease, or unspecified chronic kidney disease: Secondary | ICD-10-CM | POA: Diagnosis not present

## 2017-12-23 DIAGNOSIS — Z7982 Long term (current) use of aspirin: Secondary | ICD-10-CM | POA: Diagnosis not present

## 2017-12-23 DIAGNOSIS — R310 Gross hematuria: Secondary | ICD-10-CM | POA: Insufficient documentation

## 2017-12-23 DIAGNOSIS — E1122 Type 2 diabetes mellitus with diabetic chronic kidney disease: Secondary | ICD-10-CM | POA: Diagnosis not present

## 2017-12-23 DIAGNOSIS — Z79899 Other long term (current) drug therapy: Secondary | ICD-10-CM | POA: Insufficient documentation

## 2017-12-23 DIAGNOSIS — N209 Urinary calculus, unspecified: Secondary | ICD-10-CM | POA: Diagnosis not present

## 2017-12-23 DIAGNOSIS — N183 Chronic kidney disease, stage 3 (moderate): Secondary | ICD-10-CM | POA: Diagnosis not present

## 2017-12-23 DIAGNOSIS — N35812 Other urethral bulbous stricture, male: Secondary | ICD-10-CM | POA: Insufficient documentation

## 2017-12-23 DIAGNOSIS — R319 Hematuria, unspecified: Secondary | ICD-10-CM | POA: Diagnosis not present

## 2017-12-23 DIAGNOSIS — Z8546 Personal history of malignant neoplasm of prostate: Secondary | ICD-10-CM | POA: Diagnosis not present

## 2017-12-23 DIAGNOSIS — I10 Essential (primary) hypertension: Secondary | ICD-10-CM | POA: Insufficient documentation

## 2017-12-23 HISTORY — PX: CYSTOSCOPY WITH URETHRAL DILATATION: SHX5125

## 2017-12-23 HISTORY — DX: Personal history of other diseases of urinary system: Z87.448

## 2017-12-23 HISTORY — DX: Sleep apnea, unspecified: G47.30

## 2017-12-23 HISTORY — DX: Other specified health status: Z78.9

## 2017-12-23 HISTORY — DX: Hematuria, unspecified: R31.9

## 2017-12-23 LAB — POCT I-STAT, CHEM 8
BUN: 20 mg/dL (ref 6–20)
CHLORIDE: 106 mmol/L (ref 101–111)
Calcium, Ion: 1.19 mmol/L (ref 1.15–1.40)
Creatinine, Ser: 1.8 mg/dL — ABNORMAL HIGH (ref 0.61–1.24)
Glucose, Bld: 159 mg/dL — ABNORMAL HIGH (ref 65–99)
HCT: 35 % — ABNORMAL LOW (ref 39.0–52.0)
Hemoglobin: 11.9 g/dL — ABNORMAL LOW (ref 13.0–17.0)
Potassium: 3.9 mmol/L (ref 3.5–5.1)
SODIUM: 142 mmol/L (ref 135–145)
TCO2: 25 mmol/L (ref 22–32)

## 2017-12-23 LAB — GLUCOSE, CAPILLARY: GLUCOSE-CAPILLARY: 128 mg/dL — AB (ref 65–99)

## 2017-12-23 SURGERY — CYSTOSCOPY, WITH URETHRAL DILATION
Anesthesia: General | Laterality: Bilateral

## 2017-12-23 MED ORDER — OXYCODONE HCL 5 MG PO TABS
5.0000 mg | ORAL_TABLET | Freq: Once | ORAL | Status: DC | PRN
  Filled 2017-12-23: qty 1

## 2017-12-23 MED ORDER — CEFAZOLIN SODIUM-DEXTROSE 1-4 GM/50ML-% IV SOLN
INTRAVENOUS | Status: AC
  Filled 2017-12-23: qty 50

## 2017-12-23 MED ORDER — MIDAZOLAM HCL 5 MG/5ML IJ SOLN
INTRAMUSCULAR | Status: DC | PRN
  Administered 2017-12-23: 2 mg via INTRAVENOUS

## 2017-12-23 MED ORDER — DEXAMETHASONE SODIUM PHOSPHATE 10 MG/ML IJ SOLN
INTRAMUSCULAR | Status: AC
  Filled 2017-12-23: qty 1

## 2017-12-23 MED ORDER — OXYCODONE HCL 5 MG/5ML PO SOLN
5.0000 mg | Freq: Once | ORAL | Status: DC | PRN
  Filled 2017-12-23: qty 5

## 2017-12-23 MED ORDER — DEXTROSE 5 % IV SOLN
3.0000 g | INTRAVENOUS | Status: AC
  Administered 2017-12-23: 3 g via INTRAVENOUS
  Filled 2017-12-23: qty 3000

## 2017-12-23 MED ORDER — SODIUM CHLORIDE 0.9% FLUSH
3.0000 mL | INTRAVENOUS | Status: DC | PRN
  Filled 2017-12-23: qty 3

## 2017-12-23 MED ORDER — SODIUM CHLORIDE 0.9% FLUSH
3.0000 mL | Freq: Two times a day (BID) | INTRAVENOUS | Status: DC
  Filled 2017-12-23: qty 3

## 2017-12-23 MED ORDER — FENTANYL CITRATE (PF) 100 MCG/2ML IJ SOLN
INTRAMUSCULAR | Status: AC
  Filled 2017-12-23: qty 2

## 2017-12-23 MED ORDER — SODIUM CHLORIDE 0.9 % IV SOLN
INTRAVENOUS | Status: DC
  Administered 2017-12-23: 09:00:00 via INTRAVENOUS
  Filled 2017-12-23: qty 1000

## 2017-12-23 MED ORDER — LIDOCAINE 2% (20 MG/ML) 5 ML SYRINGE
INTRAMUSCULAR | Status: DC | PRN
  Administered 2017-12-23: 100 mg via INTRAVENOUS

## 2017-12-23 MED ORDER — CEFAZOLIN SODIUM-DEXTROSE 2-4 GM/100ML-% IV SOLN
2.0000 g | INTRAVENOUS | Status: DC
  Filled 2017-12-23: qty 100

## 2017-12-23 MED ORDER — TRAMADOL HCL 50 MG PO TABS
ORAL_TABLET | ORAL | Status: AC
  Filled 2017-12-23: qty 1

## 2017-12-23 MED ORDER — PROMETHAZINE HCL 25 MG/ML IJ SOLN
6.2500 mg | INTRAMUSCULAR | Status: DC | PRN
  Filled 2017-12-23: qty 1

## 2017-12-23 MED ORDER — TRAMADOL HCL 50 MG PO TABS
50.0000 mg | ORAL_TABLET | Freq: Four times a day (QID) | ORAL | Status: DC | PRN
  Administered 2017-12-23: 50 mg via ORAL
  Filled 2017-12-23: qty 1

## 2017-12-23 MED ORDER — CEFAZOLIN SODIUM-DEXTROSE 2-4 GM/100ML-% IV SOLN
INTRAVENOUS | Status: AC
  Filled 2017-12-23: qty 100

## 2017-12-23 MED ORDER — MIDAZOLAM HCL 2 MG/2ML IJ SOLN
INTRAMUSCULAR | Status: AC
  Filled 2017-12-23: qty 2

## 2017-12-23 MED ORDER — ONDANSETRON HCL 4 MG/2ML IJ SOLN
INTRAMUSCULAR | Status: DC | PRN
  Administered 2017-12-23: 4 mg via INTRAVENOUS

## 2017-12-23 MED ORDER — ACETAMINOPHEN 325 MG PO TABS
650.0000 mg | ORAL_TABLET | ORAL | Status: DC | PRN
  Filled 2017-12-23: qty 2

## 2017-12-23 MED ORDER — MEPERIDINE HCL 25 MG/ML IJ SOLN
6.2500 mg | INTRAMUSCULAR | Status: DC | PRN
  Filled 2017-12-23: qty 1

## 2017-12-23 MED ORDER — HYDROMORPHONE HCL 1 MG/ML IJ SOLN
INTRAMUSCULAR | Status: AC
  Filled 2017-12-23: qty 1

## 2017-12-23 MED ORDER — HYDROMORPHONE HCL 1 MG/ML IJ SOLN
0.2500 mg | INTRAMUSCULAR | Status: DC | PRN
  Administered 2017-12-23: 0.5 mg via INTRAVENOUS
  Filled 2017-12-23: qty 0.5

## 2017-12-23 MED ORDER — ACETAMINOPHEN 650 MG RE SUPP
650.0000 mg | RECTAL | Status: DC | PRN
Start: 2017-12-23 — End: 2017-12-23
  Filled 2017-12-23: qty 1

## 2017-12-23 MED ORDER — PROPOFOL 10 MG/ML IV BOLUS
INTRAVENOUS | Status: AC
Start: 2017-12-23 — End: ?
  Filled 2017-12-23: qty 20

## 2017-12-23 MED ORDER — ONDANSETRON HCL 4 MG/2ML IJ SOLN
INTRAMUSCULAR | Status: AC
Start: 2017-12-23 — End: ?
  Filled 2017-12-23: qty 2

## 2017-12-23 MED ORDER — SODIUM CHLORIDE 0.9 % IV SOLN
250.0000 mL | INTRAVENOUS | Status: DC | PRN
  Filled 2017-12-23: qty 250

## 2017-12-23 MED ORDER — LIDOCAINE 2% (20 MG/ML) 5 ML SYRINGE
INTRAMUSCULAR | Status: AC
Start: 2017-12-23 — End: ?
  Filled 2017-12-23: qty 5

## 2017-12-23 MED ORDER — FENTANYL CITRATE (PF) 100 MCG/2ML IJ SOLN
INTRAMUSCULAR | Status: DC | PRN
  Administered 2017-12-23: 50 ug via INTRAVENOUS
  Administered 2017-12-23: 25 ug via INTRAVENOUS

## 2017-12-23 MED ORDER — OXYCODONE HCL 5 MG PO TABS
5.0000 mg | ORAL_TABLET | ORAL | Status: DC | PRN
  Filled 2017-12-23: qty 2

## 2017-12-23 MED ORDER — MORPHINE SULFATE (PF) 2 MG/ML IV SOLN
2.0000 mg | INTRAVENOUS | Status: DC | PRN
  Filled 2017-12-23: qty 1

## 2017-12-23 MED ORDER — TRAMADOL HCL 50 MG PO TABS: 50.0000 mg | ORAL_TABLET | Freq: Four times a day (QID) | ORAL | 0 refills | Status: DC | PRN

## 2017-12-23 MED ORDER — PROPOFOL 10 MG/ML IV BOLUS
INTRAVENOUS | Status: DC | PRN
  Administered 2017-12-23: 50 mg via INTRAVENOUS
  Administered 2017-12-23: 250 mg via INTRAVENOUS

## 2017-12-23 SURGICAL SUPPLY — 29 items
BAG DRAIN URO-CYSTO SKYTR STRL (DRAIN) ×2 IMPLANT
BAG URINE DRAINAGE (UROLOGICAL SUPPLIES) ×2 IMPLANT
BALLN NEPHROSTOMY (BALLOONS) ×2
BALLOON NEPHROSTOMY (BALLOONS) ×1 IMPLANT
CATH FOLEY 2W COUNCIL 20FR 5CC (CATHETERS) ×2 IMPLANT
CATH FOLEY 2WAY SLVR  5CC 16FR (CATHETERS)
CATH FOLEY 2WAY SLVR 5CC 16FR (CATHETERS) IMPLANT
CLOTH BEACON ORANGE TIMEOUT ST (SAFETY) ×4 IMPLANT
ELECT REM PT RETURN 9FT ADLT (ELECTROSURGICAL)
ELECTRODE REM PT RTRN 9FT ADLT (ELECTROSURGICAL) IMPLANT
FIBER LASER FLEXIVA 1000 (UROLOGICAL SUPPLIES) ×2 IMPLANT
GLOVE SURG SS PI 8.0 STRL IVOR (GLOVE) ×2 IMPLANT
GOWN STRL REUS W/TWL XL LVL3 (GOWN DISPOSABLE) ×2 IMPLANT
GUIDEWIRE STR DUAL SENSOR (WIRE) ×2 IMPLANT
IV NS 1000ML (IV SOLUTION) ×1
IV NS 1000ML BAXH (IV SOLUTION) ×1 IMPLANT
IV NS IRRIG 3000ML ARTHROMATIC (IV SOLUTION) ×4 IMPLANT
KIT TURNOVER CYSTO (KITS) ×2 IMPLANT
MANIFOLD NEPTUNE II (INSTRUMENTS) IMPLANT
NDL SAFETY ECLIPSE 18X1.5 (NEEDLE) IMPLANT
NEEDLE HYPO 18GX1.5 SHARP (NEEDLE)
NEEDLE HYPO 22GX1.5 SAFETY (NEEDLE) IMPLANT
NS IRRIG 500ML POUR BTL (IV SOLUTION) ×2 IMPLANT
PACK CYSTO (CUSTOM PROCEDURE TRAY) ×2 IMPLANT
SYR 20CC LL (SYRINGE) IMPLANT
SYR 30ML LL (SYRINGE) IMPLANT
SYRINGE IRR TOOMEY STRL 70CC (SYRINGE) ×2 IMPLANT
TUBE CONNECTING 12X1/4 (SUCTIONS) IMPLANT
WATER STERILE IRR 3000ML UROMA (IV SOLUTION) IMPLANT

## 2017-12-23 NOTE — Anesthesia Preprocedure Evaluation (Signed)
Anesthesia Evaluation  Patient identified by MRN, date of birth, ID band Patient awake    Reviewed: Allergy & Precautions, NPO status , Patient's Chart, lab work & pertinent test results, reviewed documented beta blocker date and time   Airway Mallampati: II  TM Distance: <3 FB Neck ROM: Full    Dental no notable dental hx.    Pulmonary neg pulmonary ROS, sleep apnea ,    Pulmonary exam normal breath sounds clear to auscultation       Cardiovascular hypertension, Pt. on medications and Pt. on home beta blockers negative cardio ROS Normal cardiovascular exam Rhythm:Regular Rate:Normal     Neuro/Psych negative neurological ROS  negative psych ROS   GI/Hepatic negative GI ROS, Neg liver ROS, GERD  ,  Endo/Other  diabetes, Type 2Morbid obesity  Renal/GU Renal InsufficiencyRenal disease  negative genitourinary   Musculoskeletal negative musculoskeletal ROS (+)   Abdominal (+) + obese,   Peds negative pediatric ROS (+)  Hematology negative hematology ROS (+)   Anesthesia Other Findings   Reproductive/Obstetrics negative OB ROS                             Anesthesia Physical  Anesthesia Plan  ASA: III  Anesthesia Plan: General   Post-op Pain Management:    Induction: Intravenous  PONV Risk Score and Plan: 2 and Ondansetron, Midazolam and Treatment may vary due to age or medical condition  Airway Management Planned: LMA  Additional Equipment:   Intra-op Plan:   Post-operative Plan: Extubation in OR  Informed Consent: I have reviewed the patients History and Physical, chart, labs and discussed the procedure including the risks, benefits and alternatives for the proposed anesthesia with the patient or authorized representative who has indicated his/her understanding and acceptance.   Dental advisory given  Plan Discussed with: CRNA and Surgeon  Anesthesia Plan Comments:          Anesthesia Quick Evaluation

## 2017-12-23 NOTE — Anesthesia Procedure Notes (Signed)
Procedure Name: LMA Insertion Date/Time: 12/23/2017 9:10 AM Performed by: Bonney Aid, CRNA Pre-anesthesia Checklist: Patient identified, Emergency Drugs available, Suction available and Patient being monitored Patient Re-evaluated:Patient Re-evaluated prior to induction Oxygen Delivery Method: Circle system utilized Preoxygenation: Pre-oxygenation with 100% oxygen Induction Type: IV induction Ventilation: Mask ventilation without difficulty LMA: LMA with gastric port inserted LMA Size: 5.0 Number of attempts: 1 Airway Equipment and Method: Bite block Placement Confirmation: positive ETCO2 Tube secured with: Tape Dental Injury: Injury to tongue  Comments: LMA placed with tongue captured, removed and reseated with good TV.  Bleeding noted under tongue.

## 2017-12-23 NOTE — Discharge Instructions (Addendum)
CYSTOSCOPY HOME CARE INSTRUCTIONS  Activity: Rest for the remainder of the day.  Do not drive or operate equipment today.  You may resume normal activities in one to two days as instructed by your physician.   Meals: Drink plenty of liquids and eat light foods such as gelatin or soup this evening.  You may return to a normal meal plan tomorrow.  Return to Work: You may return to work in one to two days or as instructed by your physician.  Special Instructions / Symptoms: Call your physician if any of these symptoms occur:   -persistent or heavy bleeding  -bleeding which continues after first few urination  -large blood clots that are difficult to pass  -urine stream diminishes or stops completely  -fever equal to or higher than 101 degrees Farenheit.  -cloudy urine with a strong, foul odor  -severe pain  Females should always wipe from front to back after elimination.  You may feel some burning pain when you urinate.  This should disappear with time.  Applying moist heat to the lower abdomen or a hot tub bath may help relieve the pain. \  Indwelling Urinary Catheter Insertion, Care After This sheet gives you information about how to care for yourself after your procedure. Your health care provider may also give you more specific instructions. If you have problems or questions, contact your health care provider. What can I expect after the procedure? After the procedure, it is common to have:  Slight discomfort around your urethra where the catheter enters your body.  Follow these instructions at home:  Keep the drainage bag at or below the level of your bladder. Doing this ensures that urine can only drain out, not back into your body.  Secure the catheter tubing and drainage bag to your leg or thigh to keep it from moving.  Check the catheter tubing regularly to make sure there are no kinks or blockages.  Take showers daily to keep the catheter clean. Do not take a bath.  Do  not pull on your catheter or try to remove it.  Disconnect the tubing and drainage bag as little as possible.  Empty the drainage bag every 2-4 hours, or more often if needed. Do not let the bag get completely full.  Wash your hands with soap and water before and after touching the catheter, tubing, or drainage bag.  Do not let the drainage bag or catheter tubing touch the floor.  Drink enough fluids to keep your urine clear or pale yellow, or as told by your health care provider. Contact a health care provider if:  Urine stops flowing into the drainage bag.  You feel pain or pressure in the bladder area.  You have back pain.  Your catheter gets clogged.  Your catheter starts to leak.  Your urine looks cloudy.  Your drainage bag or tubing looks dirty.  You notice a bad smell when emptying your drainage bag. Get help right away if:  You have a fever or chills.  You have severe pain in your back or your lower abdomen.  You have warmth, redness, swelling, or pain in the urethra area.  You notice blood in your urine.  Your catheter gets pulled out. Summary  Do not pull on your catheter or try to remove it.  Keep the drainage bag at or below the level of your bladder, but do not let the drainage bag or catheter tubing touch the floor.  Wash your hands with soap  and water before and after touching the catheter, tubing, or drainage bag.  Contact your health care provider if you have a fever, chills, or any other signs of infection. This information is not intended to replace advice given to you by your health care provider. Make sure you discuss any questions you have with your health care provider. Document Released: 08/17/2016 Document Revised: 08/17/2016 Document Reviewed: 08/17/2016 Elsevier Interactive Patient Education  2018 [place] [facility] Home Care Instructions  Activity: Get plenty of rest for the remainder of the day. A responsible  individual must stay with you for 24 hours following the procedure.  For the next 24 hours, DO NOT: -Drive a car -Paediatric nurse -Drink alcoholic beverages -Take any medication unless instructed by your physician -Make any legal decisions or sign important papers.  Meals: Start with liquid foods such as gelatin or soup. Progress to regular foods as tolerated. Avoid greasy, spicy, heavy foods. If nausea and/or vomiting occur, drink only clear liquids until the nausea and/or vomiting subsides. Call your physician if vomiting continues.  Special Instructions/Symptoms: Your throat may feel dry or sore from the anesthesia or the breathing tube placed in your throat during surgery. If this causes discomfort, gargle with warm salt water. The discomfort should disappear within 24 hours.  If you had a scopolamine patch placed behind your ear for the management of post- operative nausea and/or vomiting:  1. The medication in the patch is effective for 72 hours, after which it should be removed.  Wrap patch in a tissue and discard in the trash. Wash hands thoroughly with soap and water. 2. You may remove the patch earlier than 72 hours if you experience unpleasant side effects which may include dry mouth, dizziness or visual disturbances. 3. Avoid touching the patch. Wash your hands with soap and water after contact with the patch.

## 2017-12-23 NOTE — Interval H&P Note (Signed)
History and Physical Interval Note:  12/23/2017 9:01 AM  Seth Werner  has presented today for surgery, with the diagnosis of hematuria history of stricture  The various methods of treatment have been discussed with the patient and family. After consideration of risks, benefits and other options for treatment, the patient has consented to  Procedure(s): CYSTOSCOPY WITH POSSIBLE  URETHRAL DILATATION BILATERAL RETROGRADE PYELOGRAM (Bilateral) as a surgical intervention .  The patient's history has been reviewed, patient examined, no change in status, stable for surgery.  I have reviewed the patient's chart and labs.  Questions were answered to the patient's satisfaction.     Irine Seal

## 2017-12-23 NOTE — Transfer of Care (Addendum)
Immediate Anesthesia Transfer of Care Note  Patient: BRANCH PACITTI  Procedure(s) Performed: CYSTOSCOPY WITH URETHRAL DILATATION AND LITHALOPAXY (Bilateral )  Patient Location: PACU  Anesthesia Type:General  Level of Consciousness: drowsy and responds to stimulation  Airway & Oxygen Therapy: Patient Spontanous Breathing and Patient connected to nasal cannula oxygen  Post-op Assessment: Report given to RN  Post vital signs: Reviewed and stable  Last Vitals:  Vitals Value Taken Time  BP 117/65 12/23/2017 10:01 AM  Temp    Pulse 63 12/23/2017 10:04 AM  Resp 12 12/23/2017 10:04 AM  SpO2 98 % 12/23/2017 10:04 AM  Vitals shown include unvalidated device data.  Last Pain:  Vitals:   12/23/17 0754  TempSrc:   PainSc: 0-No pain      Patients Stated Pain Goal: 5 (46/28/63 8177)  Complications: soft tissue trauma, small cut under tongue from LMA placement.  Not bleeding

## 2017-12-23 NOTE — Op Note (Signed)
Procedure: 1.  Cystoscopy with balloon dilation of membranous urethral stricture. 2.  Cystolitholapaxy less than 2.5 cm stone.  Preop diagnosis: History of post radiation membranous stricture with recurrent symptoms and hematuria.  Postop diagnosis: Same with bladder stone.  Surgeon: Dr. Irine Seal.  Anesthesia: General.  Specimen: Stone fragments.  Drains: 23 [place] council catheter.  EBL: Minimal.  Complications: None.  Indications: Mr. Gigante is a 62 year old male with a history of prostate cancer and prior radiation.  He has had a membranous urethral stricture that required prior dilation and he has been on self-catheterization to maintain the urethral lumen.  Recently there is been increased difficulty passing the catheter and he has had some blood in the urine.  It was felt that cystoscopy with evaluation and dilation of the stricture followed by cystoscopy and possible retrograde pyelograms was indicated.  Procedure: He was taken to the operating room where he was given Ancef.  A general anesthetic was induced and he was placed in the lithotomy position and fitted with PSAs.  His perineum and genitalia were prepped with Betadine solution and he was draped in usual sterile fashion.  Cystoscopy was initially performed using a 23 [place] scope and 30 degree lens.  Examination revealed a normal fossa and anterior urethra however there were some changes consistent with chronic inflammation in the distal bulb with progressively more severe stricturing and inflammatory changes as I approach the membranous urethra.  The lumen narrowed to the point where I could not get the cystoscope near the urethral stricture opening.  I then changed to a semirigid ureteroscope which I was able to negotiate through the urethral stricture into the bladder and passed a sensor wire.  The ureteroscope was removed.  A 15 cm 24 French high-pressure balloon was then passed over the wire.  The balloon was dilated to  20 cm of pressure with disruption of the stricture under fluoroscopic guidance.  The balloon was removed leaving the wire in place and an attempt was made to pass the cystoscope but this was unsuccessful.  I then used [facility] dilators over the wire to dilate the stricture to 28 [place].  I was then able to successfully advance the scope through the stricture into the bladder.  The dense portion of the stricture was approximately2 cm in length.  The prostate was rigidly fixed and inspection of the bladder was difficult.  There appeared to be a whitish 2 cm stone within the bladder portion of which was mobile.  I then switched to the laser bridge and scope and used a 1000 m holmium laser fiber set on 1 W and 20 Hz to fragment the stone.  The fragments were evacuated.  Further inspection revealed additional stone material that was adherent to the right posterior bladder neck.  The stone was fragmented and teased away from the mucosa until it had been eliminated.  The fragments were then retrieved.  In general the bladder mucosa was smooth without lesions but I was unable to identify the ureteral orifice ease due to the fixation of the bladder neck.  Once all stone fragments were removed and I was confident that hemostasis was good the cystoscope was removed and a 20 [place] council Foley catheter was placed over the wire into the bladder.  The balloon was filled with 10 cc of sterile fluid and the wire was removed.  The cath was placed to straight drainage.  Patient was taken down from lithotomy position, his anesthetic was reversed and he was moved  to recovery room in stable condition.  There were no complications.

## 2017-12-23 NOTE — Anesthesia Postprocedure Evaluation (Signed)
Anesthesia Post Note  Patient: Seth Werner  Procedure(s) Performed: CYSTOSCOPY WITH URETHRAL DILATATION AND LITHALOPAXY (Bilateral )     Patient location during evaluation: PACU Anesthesia Type: General Level of consciousness: awake and alert Pain management: pain level controlled Vital Signs Assessment: post-procedure vital signs reviewed and stable Respiratory status: spontaneous breathing, nonlabored ventilation and respiratory function stable Cardiovascular status: blood pressure returned to baseline and stable Postop Assessment: no apparent nausea or vomiting Anesthetic complications: no    Last Vitals:  Vitals:   12/23/17 1030 12/23/17 1045  BP: 120/68 126/69  Pulse: 63 (!) 57  Resp: 13 12  Temp:    SpO2: 98% 94%    Last Pain:  Vitals:   12/23/17 1030  TempSrc:   PainSc: [place]

## 2017-12-25 ENCOUNTER — Encounter (HOSPITAL_BASED_OUTPATIENT_CLINIC_OR_DEPARTMENT_OTHER): Payer: Self-pay | Admitting: Urology

## 2017-12-29 LAB — STONE ANALYSIS
Ammonium Acid Urate: 12 %
CA PHOS CRY STONE QL IR: 25 %
Ca Oxalate,Monohydr.: 3 %
MAGNESIUM AMMON PHOS: 60 %
Stone Weight KSTONE: 586.5 mg

## 2018-01-01 DIAGNOSIS — Z794 Long term (current) use of insulin: Secondary | ICD-10-CM | POA: Diagnosis not present

## 2018-01-01 DIAGNOSIS — I10 Essential (primary) hypertension: Secondary | ICD-10-CM | POA: Diagnosis not present

## 2018-01-01 DIAGNOSIS — G4733 Obstructive sleep apnea (adult) (pediatric): Secondary | ICD-10-CM | POA: Diagnosis not present

## 2018-01-01 DIAGNOSIS — N183 Chronic kidney disease, stage 3 (moderate): Secondary | ICD-10-CM | POA: Diagnosis not present

## 2018-01-01 DIAGNOSIS — E1122 Type 2 diabetes mellitus with diabetic chronic kidney disease: Secondary | ICD-10-CM | POA: Diagnosis not present

## 2018-01-01 DIAGNOSIS — Z9989 Dependence on other enabling machines and devices: Secondary | ICD-10-CM | POA: Diagnosis not present

## 2018-01-01 DIAGNOSIS — N99111 Postprocedural bulbous urethral stricture: Secondary | ICD-10-CM | POA: Diagnosis not present

## 2018-01-05 DIAGNOSIS — N35012 Post-traumatic membranous urethral stricture: Secondary | ICD-10-CM | POA: Diagnosis not present

## 2018-01-21 DIAGNOSIS — N35011 Post-traumatic bulbous urethral stricture: Secondary | ICD-10-CM | POA: Diagnosis not present

## 2018-01-21 DIAGNOSIS — Z8546 Personal history of malignant neoplasm of prostate: Secondary | ICD-10-CM | POA: Diagnosis not present

## 2018-02-12 ENCOUNTER — Ambulatory Visit: Payer: PPO | Admitting: "Endocrinology

## 2018-02-17 ENCOUNTER — Other Ambulatory Visit: Payer: Self-pay | Admitting: "Endocrinology

## 2018-02-17 DIAGNOSIS — E113512 Type 2 diabetes mellitus with proliferative diabetic retinopathy with macular edema, left eye: Secondary | ICD-10-CM | POA: Diagnosis not present

## 2018-02-17 DIAGNOSIS — H2513 Age-related nuclear cataract, bilateral: Secondary | ICD-10-CM | POA: Diagnosis not present

## 2018-02-17 DIAGNOSIS — E113591 Type 2 diabetes mellitus with proliferative diabetic retinopathy without macular edema, right eye: Secondary | ICD-10-CM | POA: Diagnosis not present

## 2018-02-17 MED ORDER — INSULIN DETEMIR 100 UNIT/ML FLEXPEN: PEN_INJECTOR | SUBCUTANEOUS | 2 refills | Status: DC

## 2018-02-18 DIAGNOSIS — R339 Retention of urine, unspecified: Secondary | ICD-10-CM | POA: Diagnosis not present

## 2018-02-20 ENCOUNTER — Telehealth: Payer: Self-pay | Admitting: "Endocrinology

## 2018-02-20 ENCOUNTER — Other Ambulatory Visit: Payer: Self-pay | Admitting: "Endocrinology

## 2018-02-20 MED ORDER — DULAGLUTIDE 1.5 MG/0.5ML ~~LOC~~ SOAJ: 1.5000 mg | SUBCUTANEOUS | 2 refills | Status: DC

## 2018-02-20 NOTE — Telephone Encounter (Signed)
Done. Thank you.

## 2018-02-20 NOTE — Telephone Encounter (Signed)
Please call [facility] at [phone] regarding refill needed today for Seth Werner

## 2018-02-27 ENCOUNTER — Encounter: Payer: Self-pay | Admitting: Cardiology

## 2018-02-27 ENCOUNTER — Ambulatory Visit: Payer: PPO | Admitting: Cardiology

## 2018-02-27 VITALS — BP 132/72 | HR 72 | Ht 65.0 in | Wt 275.8 lb

## 2018-02-27 DIAGNOSIS — H2511 Age-related nuclear cataract, right eye: Secondary | ICD-10-CM | POA: Diagnosis not present

## 2018-02-27 DIAGNOSIS — Z794 Long term (current) use of insulin: Secondary | ICD-10-CM | POA: Diagnosis not present

## 2018-02-27 DIAGNOSIS — H25013 Cortical age-related cataract, bilateral: Secondary | ICD-10-CM | POA: Diagnosis not present

## 2018-02-27 DIAGNOSIS — E782 Mixed hyperlipidemia: Secondary | ICD-10-CM

## 2018-02-27 DIAGNOSIS — I10 Essential (primary) hypertension: Secondary | ICD-10-CM | POA: Diagnosis not present

## 2018-02-27 DIAGNOSIS — R0789 Other chest pain: Secondary | ICD-10-CM | POA: Diagnosis not present

## 2018-02-27 DIAGNOSIS — H2513 Age-related nuclear cataract, bilateral: Secondary | ICD-10-CM | POA: Diagnosis not present

## 2018-02-27 DIAGNOSIS — H25043 Posterior subcapsular polar age-related cataract, bilateral: Secondary | ICD-10-CM | POA: Diagnosis not present

## 2018-02-27 DIAGNOSIS — E113593 Type 2 diabetes mellitus with proliferative diabetic retinopathy without macular edema, bilateral: Secondary | ICD-10-CM | POA: Diagnosis not present

## 2018-02-27 NOTE — Patient Instructions (Signed)
Medication Instructions:  Your physician has recommended you make the following change in your medication:    STOP Aspirin    Please continue all other medications as prescribed   Labwork: NONE  Testing/Procedures: NONE  Follow-Up: Your physician wants you to follow-up in: [place] DR. BRANCH You will receive a reminder letter in the mail two months in advance. If you don't receive a letter, please call our office to schedule the follow-up appointment.  Any Other Special Instructions Will Be Listed Below (If Applicable).  If you need a refill on your cardiac medications before your next appointment, please call your pharmacy.
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
## 2018-03-01 ENCOUNTER — Encounter: Payer: Self-pay | Admitting: Cardiology

## 2018-03-09 DIAGNOSIS — E782 Mixed hyperlipidemia: Secondary | ICD-10-CM | POA: Diagnosis not present

## 2018-03-09 DIAGNOSIS — N183 Chronic kidney disease, stage 3 (moderate): Secondary | ICD-10-CM | POA: Diagnosis not present

## 2018-03-09 DIAGNOSIS — E1122 Type 2 diabetes mellitus with diabetic chronic kidney disease: Secondary | ICD-10-CM | POA: Diagnosis not present

## 2018-03-10 LAB — LIPID PANEL
Cholesterol: 103 mg/dL (ref <200)
HDL: 37 mg/dL — ABNORMAL LOW (ref >40)
LDL Cholesterol (Calc): 52 mg/dL (calc)
Non-HDL Cholesterol (Calc): 66 mg/dL (calc) (ref <130)
TRIGLYCERIDES: 62 mg/dL (ref <150)
Total CHOL/HDL Ratio: 2.8 (calc) (ref <5.0)

## 2018-03-10 LAB — COMPLETE METABOLIC PANEL WITH GFR
AG Ratio: 1.7 (calc) (ref 1.0–2.5)
ALT: 18 U/L (ref 9–46)
AST: 19 U/L (ref 10–35)
Albumin: 4 g/dL (ref 3.6–5.1)
Alkaline phosphatase (APISO): 80 U/L (ref 40–115)
BUN/Creatinine Ratio: 10 (calc) (ref 6–22)
BUN: 18 mg/dL (ref 7–25)
CALCIUM: 8.9 mg/dL (ref 8.6–10.3)
CO2: 27 mmol/L (ref 20–32)
CREATININE: 1.87 mg/dL — AB (ref 0.70–1.25)
Chloride: 108 mmol/L (ref 98–110)
GFR, EST AFRICAN AMERICAN: 43 mL/min/{1.73_m2} — AB (ref >=60)
GFR, EST NON AFRICAN AMERICAN: 37 mL/min/{1.73_m2} — AB (ref >=60)
Globulin: 2.3 g/dL (calc) (ref 1.9–3.7)
Glucose, Bld: 111 mg/dL — ABNORMAL HIGH (ref 65–99)
Potassium: 4.1 mmol/L (ref 3.5–5.3)
Sodium: 144 mmol/L (ref 135–146)
TOTAL PROTEIN: 6.3 g/dL (ref 6.1–8.1)
Total Bilirubin: 0.4 mg/dL (ref 0.2–1.2)

## 2018-03-10 LAB — HEMOGLOBIN A1C
Hgb A1c MFr Bld: 6.8 % of total Hgb — ABNORMAL HIGH (ref <5.7)
Mean Plasma Glucose: 148 (calc)
eAG (mmol/L): 8.2 (calc)

## 2018-03-17 ENCOUNTER — Ambulatory Visit (INDEPENDENT_AMBULATORY_CARE_PROVIDER_SITE_OTHER): Payer: PPO | Admitting: "Endocrinology

## 2018-03-17 ENCOUNTER — Encounter: Payer: Self-pay | Admitting: "Endocrinology

## 2018-03-17 VITALS — BP 143/78 | HR 81 | Ht 65.0 in | Wt 273.0 lb

## 2018-03-17 DIAGNOSIS — E1122 Type 2 diabetes mellitus with diabetic chronic kidney disease: Secondary | ICD-10-CM | POA: Diagnosis not present

## 2018-03-17 DIAGNOSIS — N183 Chronic kidney disease, stage 3 unspecified: Secondary | ICD-10-CM

## 2018-03-17 DIAGNOSIS — I10 Essential (primary) hypertension: Secondary | ICD-10-CM

## 2018-03-17 DIAGNOSIS — E782 Mixed hyperlipidemia: Secondary | ICD-10-CM | POA: Diagnosis not present
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
## 2018-03-17 NOTE — Patient Instructions (Signed)

## 2018-03-25 ENCOUNTER — Ambulatory Visit: Payer: PPO | Admitting: Cardiology

## 2018-03-25 DIAGNOSIS — N35811 Other urethral stricture, male, meatal: Secondary | ICD-10-CM | POA: Diagnosis not present

## 2018-03-25 DIAGNOSIS — N35911 Unspecified urethral stricture, male, meatal: Secondary | ICD-10-CM | POA: Diagnosis not present

## 2018-03-26 DIAGNOSIS — H2511 Age-related nuclear cataract, right eye: Secondary | ICD-10-CM | POA: Diagnosis not present

## 2018-03-27 DIAGNOSIS — H2512 Age-related nuclear cataract, left eye: Secondary | ICD-10-CM | POA: Diagnosis not present

## 2018-04-16 DIAGNOSIS — H2512 Age-related nuclear cataract, left eye: Secondary | ICD-10-CM | POA: Diagnosis not present

## 2018-04-17 DIAGNOSIS — C61 Malignant neoplasm of prostate: Secondary | ICD-10-CM | POA: Diagnosis not present

## 2018-04-20 DIAGNOSIS — R339 Retention of urine, unspecified: Secondary | ICD-10-CM | POA: Diagnosis not present

## 2018-04-24 ENCOUNTER — Ambulatory Visit (INDEPENDENT_AMBULATORY_CARE_PROVIDER_SITE_OTHER): Payer: PPO | Admitting: Urology

## 2018-04-24 DIAGNOSIS — Z8546 Personal history of malignant neoplasm of prostate: Secondary | ICD-10-CM | POA: Diagnosis not present

## 2018-04-24 DIAGNOSIS — Z23 Encounter for immunization: Secondary | ICD-10-CM | POA: Diagnosis not present

## 2018-04-24 DIAGNOSIS — N35011 Post-traumatic bulbous urethral stricture: Secondary | ICD-10-CM

## 2018-04-30 DIAGNOSIS — L600 Ingrowing nail: Secondary | ICD-10-CM | POA: Diagnosis not present

## 2018-04-30 DIAGNOSIS — E1151 Type 2 diabetes mellitus with diabetic peripheral angiopathy without gangrene: Secondary | ICD-10-CM | POA: Diagnosis not present

## 2018-04-30 DIAGNOSIS — B351 Tinea unguium: Secondary | ICD-10-CM | POA: Diagnosis not present

## 2018-04-30 DIAGNOSIS — E1140 Type 2 diabetes mellitus with diabetic neuropathy, unspecified: Secondary | ICD-10-CM | POA: Diagnosis not present

## 2018-05-23 ENCOUNTER — Other Ambulatory Visit: Payer: Self-pay | Admitting: "Endocrinology

## 2018-05-27 ENCOUNTER — Other Ambulatory Visit: Payer: Self-pay | Admitting: "Endocrinology

## 2018-05-28 DIAGNOSIS — R339 Retention of urine, unspecified: Secondary | ICD-10-CM | POA: Diagnosis not present

## 2018-06-01 DIAGNOSIS — R809 Proteinuria, unspecified: Secondary | ICD-10-CM | POA: Diagnosis not present

## 2018-06-01 DIAGNOSIS — N183 Chronic kidney disease, stage 3 (moderate): Secondary | ICD-10-CM | POA: Diagnosis not present

## 2018-06-01 DIAGNOSIS — E1129 Type 2 diabetes mellitus with other diabetic kidney complication: Secondary | ICD-10-CM | POA: Diagnosis not present

## 2018-06-01 DIAGNOSIS — E559 Vitamin D deficiency, unspecified: Secondary | ICD-10-CM | POA: Diagnosis not present

## 2018-06-09 DIAGNOSIS — N183 Chronic kidney disease, stage 3 (moderate): Secondary | ICD-10-CM | POA: Diagnosis not present

## 2018-06-09 DIAGNOSIS — E1122 Type 2 diabetes mellitus with diabetic chronic kidney disease: Secondary | ICD-10-CM | POA: Diagnosis not present

## 2018-06-10 LAB — COMPLETE METABOLIC PANEL WITH GFR
AG Ratio: 1.4 (calc) (ref 1.0–2.5)
ALBUMIN MSPROF: 3.7 g/dL (ref 3.6–5.1)
ALKALINE PHOSPHATASE (APISO): 88 U/L (ref 40–115)
ALT: 18 U/L (ref 9–46)
AST: 21 U/L (ref 10–35)
BILIRUBIN TOTAL: 0.4 mg/dL (ref 0.2–1.2)
BUN / CREAT RATIO: 12 (calc) (ref 6–22)
BUN: 21 mg/dL (ref 7–25)
CHLORIDE: 106 mmol/L (ref 98–110)
CO2: 26 mmol/L (ref 20–32)
Calcium: 8.8 mg/dL (ref 8.6–10.3)
Creat: 1.79 mg/dL — ABNORMAL HIGH (ref 0.70–1.25)
GFR, Est African American: 45 mL/min/{1.73_m2} — ABNORMAL LOW (ref >=60)
GFR, Est Non African American: 39 mL/min/{1.73_m2} — ABNORMAL LOW (ref >=60)
GLUCOSE: 114 mg/dL — AB (ref 65–99)
Globulin: 2.6 g/dL (calc) (ref 1.9–3.7)
Potassium: 4.1 mmol/L (ref 3.5–5.3)
Sodium: 141 mmol/L (ref 135–146)
TOTAL PROTEIN: 6.3 g/dL (ref 6.1–8.1)

## 2018-06-10 LAB — HEMOGLOBIN A1C
EAG (MMOL/L): 8.4 (calc)
Hgb A1c MFr Bld: 6.9 % of total Hgb — ABNORMAL HIGH (ref <5.7)
MEAN PLASMA GLUCOSE: 151 (calc)

## 2018-06-17 ENCOUNTER — Encounter: Payer: Self-pay | Admitting: "Endocrinology

## 2018-06-17 ENCOUNTER — Ambulatory Visit: Payer: PPO | Admitting: "Endocrinology

## 2018-06-17 VITALS — BP 130/76 | HR 73 | Ht 65.0 in | Wt 273.0 lb

## 2018-06-17 DIAGNOSIS — E782 Mixed hyperlipidemia: Secondary | ICD-10-CM | POA: Diagnosis not present

## 2018-06-17 DIAGNOSIS — E1122 Type 2 diabetes mellitus with diabetic chronic kidney disease: Secondary | ICD-10-CM | POA: Diagnosis not present

## 2018-06-17 DIAGNOSIS — N183 Chronic kidney disease, stage 3 unspecified: Secondary | ICD-10-CM

## 2018-06-17 DIAGNOSIS — I10 Essential (primary) hypertension: Secondary | ICD-10-CM | POA: Diagnosis not present
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
## 2018-06-17 NOTE — Patient Instructions (Signed)

## 2018-07-07 DIAGNOSIS — I10 Essential (primary) hypertension: Secondary | ICD-10-CM | POA: Diagnosis not present

## 2018-07-07 DIAGNOSIS — Z794 Long term (current) use of insulin: Secondary | ICD-10-CM | POA: Diagnosis not present

## 2018-07-07 DIAGNOSIS — N183 Chronic kidney disease, stage 3 (moderate): Secondary | ICD-10-CM | POA: Diagnosis not present

## 2018-07-07 DIAGNOSIS — E118 Type 2 diabetes mellitus with unspecified complications: Secondary | ICD-10-CM | POA: Diagnosis not present

## 2018-07-07 DIAGNOSIS — Z23 Encounter for immunization: Secondary | ICD-10-CM | POA: Diagnosis not present

## 2018-07-09 DIAGNOSIS — M79672 Pain in left foot: Secondary | ICD-10-CM | POA: Diagnosis not present

## 2018-07-09 DIAGNOSIS — M779 Enthesopathy, unspecified: Secondary | ICD-10-CM | POA: Diagnosis not present

## 2018-07-18 DIAGNOSIS — E7800 Pure hypercholesterolemia, unspecified: Secondary | ICD-10-CM | POA: Diagnosis not present

## 2018-07-18 DIAGNOSIS — Z7984 Long term (current) use of oral hypoglycemic drugs: Secondary | ICD-10-CM | POA: Diagnosis not present

## 2018-07-18 DIAGNOSIS — S0990XA Unspecified injury of head, initial encounter: Secondary | ICD-10-CM | POA: Diagnosis not present

## 2018-07-18 DIAGNOSIS — E109 Type 1 diabetes mellitus without complications: Secondary | ICD-10-CM | POA: Diagnosis not present

## 2018-07-18 DIAGNOSIS — S42202A Unspecified fracture of upper end of left humerus, initial encounter for closed fracture: Secondary | ICD-10-CM | POA: Diagnosis not present

## 2018-07-18 DIAGNOSIS — Z79899 Other long term (current) drug therapy: Secondary | ICD-10-CM | POA: Diagnosis not present

## 2018-07-18 DIAGNOSIS — S42352A Displaced comminuted fracture of shaft of humerus, left arm, initial encounter for closed fracture: Secondary | ICD-10-CM | POA: Diagnosis not present

## 2018-07-18 DIAGNOSIS — Z8546 Personal history of malignant neoplasm of prostate: Secondary | ICD-10-CM | POA: Diagnosis not present

## 2018-07-18 DIAGNOSIS — S42212A Unspecified displaced fracture of surgical neck of left humerus, initial encounter for closed fracture: Secondary | ICD-10-CM | POA: Diagnosis not present

## 2018-07-18 DIAGNOSIS — W0110XA Fall on same level from slipping, tripping and stumbling with subsequent striking against unspecified object, initial encounter: Secondary | ICD-10-CM | POA: Diagnosis not present

## 2018-07-18 DIAGNOSIS — I10 Essential (primary) hypertension: Secondary | ICD-10-CM | POA: Diagnosis not present

## 2018-07-20 ENCOUNTER — Other Ambulatory Visit: Payer: Self-pay | Admitting: Orthopedic Surgery

## 2018-07-20 DIAGNOSIS — M25512 Pain in left shoulder: Secondary | ICD-10-CM | POA: Diagnosis not present

## 2018-07-20 DIAGNOSIS — R339 Retention of urine, unspecified: Secondary | ICD-10-CM | POA: Diagnosis not present

## 2018-07-21 ENCOUNTER — Encounter (HOSPITAL_COMMUNITY): Payer: Self-pay | Admitting: *Deleted

## 2018-07-21 DIAGNOSIS — S42202A Unspecified fracture of upper end of left humerus, initial encounter for closed fracture: Secondary | ICD-10-CM | POA: Diagnosis present

## 2018-07-21 NOTE — Patient Instructions (Addendum)
HAWKINS SEAMAN  07/21/2018   Your procedure is scheduled on: Tuesday 07/28/2018  Report to [facility] Main  Entrance              Report to admitting at 105  PM    Call this number if you have problems the morning of surgery [phone]    Remember: Do not eat food after: midnight.  May have clear liquids from midnight up until0905 am then nothing until after surgery!    CLEAR LIQUID DIET   Foods Allowed                                                                     Foods Excluded  Coffee and tea, regular and decaf                             liquids that you cannot  Plain Jell-O in any flavor                                             see through such as: Fruit ices (not with fruit pulp)                                     milk, soups, orange juice  Iced Popsicles                                    All solid food Carbonated beverages, regular and diet                                    Cranberry, grape and apple juices Sports drinks like Gatorade Lightly seasoned clear broth or consume(fat free) Sugar, honey syrup  Sample Menu Breakfast                                Lunch                                     Supper Cranberry juice                    Beef broth                            Chicken broth Jell-O                                     Grape juice  Apple juice Coffee or tea                        Jell-O                                      Popsicle                                                Coffee or tea                        Coffee or tea  _____________________________________________________________________ How to Manage Your Diabetes Before and After Surgery  Why is it important to control my blood sugar before and after surgery? . Improving blood sugar levels before and after surgery helps healing and can limit problems. . A way of improving blood sugar control is eating a healthy diet by: o  Eating  less sugar and carbohydrates o  Increasing activity/exercise o  Talking with your doctor about reaching your blood sugar goals . High blood sugars (greater than 180 mg/dL) can raise your risk of infections and slow your recovery, so you will need to focus on controlling your diabetes during the weeks before surgery. . Make sure that the doctor who takes care of your diabetes knows about your planned surgery including the date and location.  How do I manage my blood sugar before surgery? . Check your blood sugar at least 4 times a day, starting 2 days before surgery, to make sure that the level is not too high or low. o Check your blood sugar the morning of your surgery when you wake up and every 2 hours until you get to the Short Stay unit. . If your blood sugar is less than 70 mg/dL, you will need to treat for low blood sugar: o Do not take insulin. o Treat a low blood sugar (less than 70 mg/dL) with  cup of clear juice (cranberry or apple), 4 glucose tablets, OR glucose gel. o Recheck blood sugar in 15 minutes after treatment (to make sure it is greater than 70 mg/dL). If your blood sugar is not greater than 70 mg/dL on recheck, call [phone] for further instructions. . Report your blood sugar to the short stay nurse when you get to Short Stay.  . If you are admitted to the hospital after surgery: o Your blood sugar will be checked by the staff and you will probably be given insulin after surgery (instead of oral diabetes medicines) to make sure you have good blood sugar levels. o The goal for blood sugar control after surgery is 80-180 mg/dL.   WHAT DO I DO ABOUT MY DIABETES MEDICATION?         Marland Kitchen Do not take oral diabetes medicines (pills) the morning of surgery.  . THE NIGHT BEFORE SURGERY, take  27   units of  Levemir Flexpen    insulin.       . The day of surgery, do not take other diabetes injectables, including Byetta (exenatide), Bydureon (exenatide ER), Victoza  (liraglutide), or Trulicity (dulaglutide).                    BRUSH YOUR TEETH MORNING OF SURGERY  AND RINSE YOUR MOUTH OUT, NO CHEWING GUM CANDY OR MINTS.      Take these medicines the morning of surgery with A SIP OF WATER: Amlodipine (Norvasc), Carvedilol (Coreg), Hydralazine (Apresoline)              DO NOT TAKE ANY DIABETIC MEDICATIONS DAY OF YOUR SURGERY!                               You may not have any metal on your body including hair pins and              piercings  Do not wear jewelry, make-up, lotions, powders or perfumes, deodorant                      Men may shave face and neck.   Do not bring valuables to the hospital. [place].  Contacts, dentures or bridgework may not be worn into surgery.  Leave suitcase in the car. After surgery it may be brought to your room.                  Please read over the following fact sheets you were given: _____________________________________________________________________             [facility] - Preparing for Surgery Before surgery, you can play an important role.  Because skin is not sterile, your skin needs to be as free of germs as possible.  You can reduce the number of germs on your skin by washing with CHG (chlorahexidine gluconate) soap before surgery.  CHG is an antiseptic cleaner which kills germs and bonds with the skin to continue killing germs even after washing. Please DO NOT use if you have an allergy to CHG or antibacterial soaps.  If your skin becomes reddened/irritated stop using the CHG and inform your nurse when you arrive at Short Stay. Do not shave (including legs and underarms) for at least 48 hours prior to the first CHG shower.  You may shave your face/neck. Please follow these instructions carefully:  1.  Shower with CHG Soap the night before surgery and the  morning of Surgery.  2.  If you choose to wash your hair, wash your hair first as usual with  your  normal  shampoo.  3.  After you shampoo, rinse your hair and body thoroughly to remove the  shampoo.                           4.  Use CHG as you would any other liquid soap.  You can apply chg directly  to the skin and wash                       Gently with a scrungie or clean washcloth.  5.  Apply the CHG Soap to your body ONLY FROM THE NECK DOWN.   Do not use on face/ open                           Wound or open sores. Avoid contact with eyes, ears mouth and genitals (private parts).  Wash face,  Genitals (private parts) with your normal soap.             6.  Wash thoroughly, paying special attention to the area where your surgery  will be performed.  7.  Thoroughly rinse your body with warm water from the neck down.  8.  DO NOT shower/wash with your normal soap after using and rinsing off  the CHG Soap.                9.  Pat yourself dry with a clean towel.            10.  Wear clean pajamas.            11.  Place clean sheets on your bed the night of your first shower and do not  sleep with pets. Day of Surgery : Do not apply any lotions/deodorants the morning of surgery.  Please wear clean clothes to the hospital/surgery center.  FAILURE TO FOLLOW THESE INSTRUCTIONS MAY RESULT IN THE CANCELLATION OF YOUR SURGERY PATIENT SIGNATURE_________________________________  NURSE SIGNATURE__________________________________  ________________________________________________________________________

## 2018-07-23 ENCOUNTER — Encounter (HOSPITAL_COMMUNITY): Payer: Self-pay

## 2018-07-23 ENCOUNTER — Encounter (HOSPITAL_COMMUNITY)
Admission: RE | Admit: 2018-07-23 | Discharge: 2018-07-23 | Disposition: A | Discharge status: Home / Self Care | Payer: PPO | Source: Ambulatory Visit | Attending: General Practice | Admitting: General Practice

## 2018-07-23 ENCOUNTER — Other Ambulatory Visit: Payer: Self-pay

## 2018-07-23 ENCOUNTER — Encounter (HOSPITAL_COMMUNITY): Payer: Self-pay | Admitting: Physician Assistant

## 2018-07-23 DIAGNOSIS — Z8546 Personal history of malignant neoplasm of prostate: Secondary | ICD-10-CM | POA: Diagnosis not present

## 2018-07-23 DIAGNOSIS — X58XXXA Exposure to other specified factors, initial encounter: Secondary | ICD-10-CM | POA: Insufficient documentation

## 2018-07-23 DIAGNOSIS — Z79899 Other long term (current) drug therapy: Secondary | ICD-10-CM | POA: Insufficient documentation

## 2018-07-23 DIAGNOSIS — S42202A Unspecified fracture of upper end of left humerus, initial encounter for closed fracture: Secondary | ICD-10-CM | POA: Insufficient documentation

## 2018-07-23 DIAGNOSIS — E1122 Type 2 diabetes mellitus with diabetic chronic kidney disease: Secondary | ICD-10-CM | POA: Insufficient documentation

## 2018-07-23 DIAGNOSIS — I129 Hypertensive chronic kidney disease with stage 1 through stage 4 chronic kidney disease, or unspecified chronic kidney disease: Secondary | ICD-10-CM | POA: Insufficient documentation

## 2018-07-23 DIAGNOSIS — K219 Gastro-esophageal reflux disease without esophagitis: Secondary | ICD-10-CM | POA: Insufficient documentation

## 2018-07-23 DIAGNOSIS — N189 Chronic kidney disease, unspecified: Secondary | ICD-10-CM | POA: Diagnosis not present

## 2018-07-23 DIAGNOSIS — Z01818 Encounter for other preprocedural examination: Secondary | ICD-10-CM | POA: Insufficient documentation

## 2018-07-23 DIAGNOSIS — G4733 Obstructive sleep apnea (adult) (pediatric): Secondary | ICD-10-CM | POA: Insufficient documentation

## 2018-07-23 LAB — BASIC METABOLIC PANEL
Anion gap: 8 (ref 5–15)
BUN: 24 mg/dL — ABNORMAL HIGH (ref 8–23)
CO2: 26 mmol/L (ref 22–32)
Calcium: 8.5 mg/dL — ABNORMAL LOW (ref 8.9–10.3)
Chloride: 107 mmol/L (ref 98–111)
Creatinine, Ser: 1.98 mg/dL — ABNORMAL HIGH (ref 0.61–1.24)
GFR calc non Af Amer: 35 mL/min — ABNORMAL LOW (ref >60)
GFR, EST AFRICAN AMERICAN: 40 mL/min — AB (ref >60)
Glucose, Bld: 131 mg/dL — ABNORMAL HIGH (ref 70–99)
Potassium: 4.5 mmol/L (ref 3.5–5.1)
Sodium: 141 mmol/L (ref 135–145)

## 2018-07-23 LAB — CBC
HEMATOCRIT: 37.9 % — AB (ref 39.0–52.0)
Hemoglobin: 12.2 g/dL — ABNORMAL LOW (ref 13.0–17.0)
MCH: 30.1 pg (ref 26.0–34.0)
MCHC: 32.2 g/dL (ref 30.0–36.0)
MCV: 93.6 fL (ref 80.0–100.0)
Platelets: 211 10*3/uL (ref 150–400)
RBC: 4.05 MIL/uL — ABNORMAL LOW (ref 4.22–5.81)
RDW: 12.8 % (ref 11.5–15.5)
WBC: 5.1 10*3/uL (ref 4.0–10.5)
nRBC: 0 % (ref 0.0–0.2)

## 2018-07-23 LAB — GLUCOSE, CAPILLARY: Glucose-Capillary: 129 mg/dL — ABNORMAL HIGH (ref 70–99)

## 2018-07-23 NOTE — Anesthesia Preprocedure Evaluation (Deleted)
Anesthesia Evaluation    Airway        Dental   Pulmonary           Cardiovascular hypertension,      Neuro/Psych    GI/Hepatic   Endo/Other  diabetes  Renal/GU      Musculoskeletal   Abdominal   Peds  Hematology   Anesthesia Other Findings   Reproductive/Obstetrics                             Anesthesia Physical Anesthesia Plan  ASA:   Anesthesia Plan:    Post-op Pain Management:    Induction:   PONV Risk Score and Plan:   Airway Management Planned:   Additional Equipment:   Intra-op Plan:   Post-operative Plan:   Informed Consent:   Plan Discussed with:   Anesthesia Plan Comments: (See PST note 07/23/18, [name], PA-C)        Anesthesia Quick Evaluation
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
## 2018-07-23 NOTE — Progress Notes (Signed)
06/09/2018- noted in [place]- HgA1C-6.9  02/27/2018- noted in Epic EKG and office note from Dr. Harl Bowie, Cardiology

## 2018-07-24 ENCOUNTER — Inpatient Hospital Stay: Admission: RE | Admit: 2018-07-24 | Payer: PPO | Source: Ambulatory Visit

## 2018-07-24 ENCOUNTER — Other Ambulatory Visit: Payer: Self-pay | Admitting: "Endocrinology

## 2018-07-24 ENCOUNTER — Ambulatory Visit
Admission: RE | Admit: 2018-07-24 | Discharge: 2018-07-24 | Disposition: A | Discharge status: Home / Self Care | Payer: PPO | Source: Ambulatory Visit | Attending: Orthopedic Surgery | Admitting: Orthopedic Surgery

## 2018-07-24 DIAGNOSIS — S42252A Displaced fracture of greater tuberosity of left humerus, initial encounter for closed fracture: Secondary | ICD-10-CM | POA: Diagnosis not present

## 2018-07-24 DIAGNOSIS — M25512 Pain in left shoulder: Secondary | ICD-10-CM

## 2018-07-27 DIAGNOSIS — S42295A Other nondisplaced fracture of upper end of left humerus, initial encounter for closed fracture: Secondary | ICD-10-CM | POA: Diagnosis not present

## 2018-07-27 MED ORDER — CEFAZOLIN SODIUM 10 G IJ SOLR
3.0000 g | INTRAMUSCULAR | Status: DC
  Filled 2018-07-27: qty 3000

## 2018-07-28 ENCOUNTER — Encounter (HOSPITAL_COMMUNITY): Admission: RE | Payer: Self-pay | Source: Home / Self Care

## 2018-07-28 ENCOUNTER — Ambulatory Visit (HOSPITAL_COMMUNITY): Admission: RE | Admit: 2018-07-28 | Payer: PPO | Source: Home / Self Care | Admitting: Orthopedic Surgery

## 2018-07-28 SURGERY — OPEN REDUCTION INTERNAL FIXATION (ORIF) PROXIMAL HUMERUS FRACTURE
Anesthesia: Choice | Laterality: Left

## 2018-08-07 DIAGNOSIS — S42295D Other nondisplaced fracture of upper end of left humerus, subsequent encounter for fracture with routine healing: Secondary | ICD-10-CM | POA: Diagnosis not present

## 2018-08-12 DIAGNOSIS — E113512 Type 2 diabetes mellitus with proliferative diabetic retinopathy with macular edema, left eye: Secondary | ICD-10-CM | POA: Diagnosis not present

## 2018-08-12 DIAGNOSIS — E113591 Type 2 diabetes mellitus with proliferative diabetic retinopathy without macular edema, right eye: Secondary | ICD-10-CM | POA: Diagnosis not present

## 2018-08-25 ENCOUNTER — Other Ambulatory Visit: Payer: Self-pay | Admitting: "Endocrinology

## 2018-08-25 DIAGNOSIS — R6889 Other general symptoms and signs: Secondary | ICD-10-CM | POA: Diagnosis not present

## 2018-08-25 DIAGNOSIS — Z20828 Contact with and (suspected) exposure to other viral communicable diseases: Secondary | ICD-10-CM | POA: Diagnosis not present

## 2018-08-25 DIAGNOSIS — R339 Retention of urine, unspecified: Secondary | ICD-10-CM | POA: Diagnosis not present

## 2018-08-31 DIAGNOSIS — S42295D Other nondisplaced fracture of upper end of left humerus, subsequent encounter for fracture with routine healing: Secondary | ICD-10-CM | POA: Diagnosis not present

## 2018-09-01 DIAGNOSIS — M25512 Pain in left shoulder: Secondary | ICD-10-CM | POA: Diagnosis not present

## 2018-09-02 DIAGNOSIS — E559 Vitamin D deficiency, unspecified: Secondary | ICD-10-CM | POA: Diagnosis not present

## 2018-09-02 DIAGNOSIS — D509 Iron deficiency anemia, unspecified: Secondary | ICD-10-CM | POA: Diagnosis not present

## 2018-09-02 DIAGNOSIS — R809 Proteinuria, unspecified: Secondary | ICD-10-CM | POA: Diagnosis not present

## 2018-09-02 DIAGNOSIS — Z79899 Other long term (current) drug therapy: Secondary | ICD-10-CM | POA: Diagnosis not present

## 2018-09-02 DIAGNOSIS — N183 Chronic kidney disease, stage 3 (moderate): Secondary | ICD-10-CM | POA: Diagnosis not present

## 2018-09-02 DIAGNOSIS — Z1159 Encounter for screening for other viral diseases: Secondary | ICD-10-CM | POA: Diagnosis not present

## 2018-09-02 DIAGNOSIS — I129 Hypertensive chronic kidney disease with stage 1 through stage 4 chronic kidney disease, or unspecified chronic kidney disease: Secondary | ICD-10-CM | POA: Diagnosis not present

## 2018-09-03 DIAGNOSIS — M25512 Pain in left shoulder: Secondary | ICD-10-CM | POA: Diagnosis not present

## 2018-09-08 DIAGNOSIS — M25512 Pain in left shoulder: Secondary | ICD-10-CM | POA: Diagnosis not present

## 2018-09-10 DIAGNOSIS — M25512 Pain in left shoulder: Secondary | ICD-10-CM | POA: Diagnosis not present

## 2018-09-15 DIAGNOSIS — M25512 Pain in left shoulder: Secondary | ICD-10-CM | POA: Diagnosis not present

## 2018-09-17 DIAGNOSIS — M25512 Pain in left shoulder: Secondary | ICD-10-CM | POA: Diagnosis not present

## 2018-09-22 DIAGNOSIS — M25512 Pain in left shoulder: Secondary | ICD-10-CM | POA: Diagnosis not present

## 2018-09-24 DIAGNOSIS — S93332D Other subluxation of left foot, subsequent encounter: Secondary | ICD-10-CM | POA: Diagnosis not present

## 2018-09-24 DIAGNOSIS — M779 Enthesopathy, unspecified: Secondary | ICD-10-CM | POA: Diagnosis not present

## 2018-09-24 DIAGNOSIS — M79672 Pain in left foot: Secondary | ICD-10-CM | POA: Diagnosis not present

## 2018-09-24 DIAGNOSIS — M25512 Pain in left shoulder: Secondary | ICD-10-CM | POA: Diagnosis not present

## 2018-09-24 DIAGNOSIS — E1140 Type 2 diabetes mellitus with diabetic neuropathy, unspecified: Secondary | ICD-10-CM | POA: Diagnosis not present

## 2018-09-24 DIAGNOSIS — M79675 Pain in left toe(s): Secondary | ICD-10-CM | POA: Diagnosis not present

## 2018-09-24 DIAGNOSIS — M25579 Pain in unspecified ankle and joints of unspecified foot: Secondary | ICD-10-CM | POA: Diagnosis not present

## 2018-09-28 DIAGNOSIS — S42295D Other nondisplaced fracture of upper end of left humerus, subsequent encounter for fracture with routine healing: Secondary | ICD-10-CM | POA: Diagnosis not present

## 2018-09-29 DIAGNOSIS — M25512 Pain in left shoulder: Secondary | ICD-10-CM | POA: Diagnosis not present

## 2018-10-01 DIAGNOSIS — M25512 Pain in left shoulder: Secondary | ICD-10-CM | POA: Diagnosis not present

## 2018-10-06 DIAGNOSIS — M25512 Pain in left shoulder: Secondary | ICD-10-CM | POA: Diagnosis not present

## 2018-10-08 DIAGNOSIS — M25512 Pain in left shoulder: Secondary | ICD-10-CM | POA: Diagnosis not present

## 2018-10-13 DIAGNOSIS — M25512 Pain in left shoulder: Secondary | ICD-10-CM | POA: Diagnosis not present

## 2018-10-15 DIAGNOSIS — M25512 Pain in left shoulder: Secondary | ICD-10-CM | POA: Diagnosis not present

## 2018-10-16 ENCOUNTER — Ambulatory Visit: Payer: PPO | Admitting: "Endocrinology

## 2018-10-19 DIAGNOSIS — R339 Retention of urine, unspecified: Secondary | ICD-10-CM | POA: Diagnosis not present

## 2018-10-20 DIAGNOSIS — M25512 Pain in left shoulder: Secondary | ICD-10-CM | POA: Diagnosis not present

## 2018-10-22 DIAGNOSIS — M25512 Pain in left shoulder: Secondary | ICD-10-CM | POA: Diagnosis not present

## 2018-10-22 DIAGNOSIS — R4020 Unspecified coma: Secondary | ICD-10-CM | POA: Diagnosis not present

## 2018-10-22 DIAGNOSIS — R404 Transient alteration of awareness: Secondary | ICD-10-CM | POA: Diagnosis not present

## 2018-10-22 DIAGNOSIS — R069 Unspecified abnormalities of breathing: Secondary | ICD-10-CM | POA: Diagnosis not present

## 2018-10-22 DIAGNOSIS — J90 Pleural effusion, not elsewhere classified: Secondary | ICD-10-CM | POA: Diagnosis not present

## 2018-10-22 DIAGNOSIS — I499 Cardiac arrhythmia, unspecified: Secondary | ICD-10-CM | POA: Diagnosis not present

## 2018-10-23 ENCOUNTER — Other Ambulatory Visit: Payer: Self-pay | Admitting: "Endocrinology

## 2018-10-27 DIAGNOSIS — H2512 Age-related nuclear cataract, left eye: Secondary | ICD-10-CM | POA: Diagnosis not present

## 2018-10-27 DIAGNOSIS — K668 Other specified disorders of peritoneum: Secondary | ICD-10-CM | POA: Diagnosis not present

## 2018-10-27 DIAGNOSIS — C61 Malignant neoplasm of prostate: Secondary | ICD-10-CM | POA: Diagnosis not present

## 2018-10-27 DIAGNOSIS — E785 Hyperlipidemia, unspecified: Secondary | ICD-10-CM | POA: Diagnosis not present

## 2018-10-27 DIAGNOSIS — K631 Perforation of intestine (nontraumatic): Secondary | ICD-10-CM | POA: Diagnosis not present

## 2018-10-27 DIAGNOSIS — I10 Essential (primary) hypertension: Secondary | ICD-10-CM | POA: Diagnosis not present

## 2018-10-27 DIAGNOSIS — E1122 Type 2 diabetes mellitus with diabetic chronic kidney disease: Secondary | ICD-10-CM | POA: Diagnosis not present

## 2018-10-27 DIAGNOSIS — M25512 Pain in left shoulder: Secondary | ICD-10-CM | POA: Diagnosis not present

## 2018-10-27 DIAGNOSIS — F0390 Unspecified dementia without behavioral disturbance: Secondary | ICD-10-CM | POA: Diagnosis not present

## 2018-10-27 DIAGNOSIS — I480 Paroxysmal atrial fibrillation: Secondary | ICD-10-CM | POA: Diagnosis not present

## 2018-10-27 DIAGNOSIS — R198 Other specified symptoms and signs involving the digestive system and abdomen: Secondary | ICD-10-CM | POA: Diagnosis not present

## 2018-10-27 DIAGNOSIS — K219 Gastro-esophageal reflux disease without esophagitis: Secondary | ICD-10-CM | POA: Diagnosis not present

## 2018-10-27 DIAGNOSIS — M069 Rheumatoid arthritis, unspecified: Secondary | ICD-10-CM | POA: Diagnosis not present

## 2018-10-29 DIAGNOSIS — M25512 Pain in left shoulder: Secondary | ICD-10-CM | POA: Diagnosis not present

## 2018-10-29 DIAGNOSIS — I480 Paroxysmal atrial fibrillation: Secondary | ICD-10-CM | POA: Diagnosis not present

## 2018-10-29 DIAGNOSIS — R5383 Other fatigue: Secondary | ICD-10-CM | POA: Diagnosis not present

## 2018-11-02 DIAGNOSIS — Z8546 Personal history of malignant neoplasm of prostate: Secondary | ICD-10-CM | POA: Diagnosis not present

## 2018-11-02 DIAGNOSIS — D509 Iron deficiency anemia, unspecified: Secondary | ICD-10-CM | POA: Diagnosis not present

## 2018-11-02 DIAGNOSIS — N35011 Post-traumatic bulbous urethral stricture: Secondary | ICD-10-CM | POA: Diagnosis not present

## 2018-11-04 DIAGNOSIS — M25512 Pain in left shoulder: Secondary | ICD-10-CM | POA: Diagnosis not present

## 2018-11-05 DIAGNOSIS — M25512 Pain in left shoulder: Secondary | ICD-10-CM | POA: Diagnosis not present

## 2018-11-10 DIAGNOSIS — M25512 Pain in left shoulder: Secondary | ICD-10-CM | POA: Diagnosis not present

## 2018-11-12 DIAGNOSIS — M25512 Pain in left shoulder: Secondary | ICD-10-CM | POA: Diagnosis not present

## 2018-11-17 DIAGNOSIS — M25512 Pain in left shoulder: Secondary | ICD-10-CM | POA: Diagnosis not present

## 2018-11-19 DIAGNOSIS — M25512 Pain in left shoulder: Secondary | ICD-10-CM | POA: Diagnosis not present

## 2018-11-20 ENCOUNTER — Other Ambulatory Visit: Payer: Self-pay | Admitting: "Endocrinology

## 2018-11-20 DIAGNOSIS — 419620001 Death: Secondary | SNOMED CT | POA: Diagnosis not present

## 2018-11-20 DEATH — deceased

## 2018-12-04 DIAGNOSIS — R339 Retention of urine, unspecified: Secondary | ICD-10-CM | POA: Diagnosis not present

## 2018-12-17 DIAGNOSIS — B351 Tinea unguium: Secondary | ICD-10-CM | POA: Diagnosis not present

## 2018-12-17 DIAGNOSIS — L600 Ingrowing nail: Secondary | ICD-10-CM | POA: Diagnosis not present

## 2018-12-17 DIAGNOSIS — E1140 Type 2 diabetes mellitus with diabetic neuropathy, unspecified: Secondary | ICD-10-CM | POA: Diagnosis not present

## 2018-12-17 DIAGNOSIS — E1151 Type 2 diabetes mellitus with diabetic peripheral angiopathy without gangrene: Secondary | ICD-10-CM | POA: Diagnosis not present

## 2019-01-06 DIAGNOSIS — Z794 Long term (current) use of insulin: Secondary | ICD-10-CM | POA: Diagnosis not present

## 2019-01-06 DIAGNOSIS — E118 Type 2 diabetes mellitus with unspecified complications: Secondary | ICD-10-CM | POA: Diagnosis not present

## 2019-01-15 DIAGNOSIS — E113591 Type 2 diabetes mellitus with proliferative diabetic retinopathy without macular edema, right eye: Secondary | ICD-10-CM | POA: Diagnosis not present

## 2019-01-15 DIAGNOSIS — H2513 Age-related nuclear cataract, bilateral: Secondary | ICD-10-CM | POA: Diagnosis not present

## 2019-01-15 DIAGNOSIS — H4311 Vitreous hemorrhage, right eye: Secondary | ICD-10-CM | POA: Diagnosis not present

## 2019-01-15 DIAGNOSIS — E113512 Type 2 diabetes mellitus with proliferative diabetic retinopathy with macular edema, left eye: Secondary | ICD-10-CM | POA: Diagnosis not present

## 2019-01-28 DIAGNOSIS — E113512 Type 2 diabetes mellitus with proliferative diabetic retinopathy with macular edema, left eye: Secondary | ICD-10-CM | POA: Diagnosis not present

## 2019-01-28 DIAGNOSIS — H33332 Multiple defects of retina without detachment, left eye: Secondary | ICD-10-CM | POA: Diagnosis not present

## 2019-01-28 DIAGNOSIS — H4312 Vitreous hemorrhage, left eye: Secondary | ICD-10-CM | POA: Diagnosis not present

## 2019-01-29 DIAGNOSIS — E113512 Type 2 diabetes mellitus with proliferative diabetic retinopathy with macular edema, left eye: Secondary | ICD-10-CM | POA: Diagnosis not present

## 2019-01-29 IMAGING — CT CT SHOULDER*L* W/O CM
1 series · 12 of 14 positions shown, 15 images · non-contrast
Comparison: Left shoulder radiographs 07/18/2018.

CLINICAL DATA: Fall 07/18/2018 with shoulder pain and limited range
of motion. Evaluate proximal humeral fracture.

EXAM:
CT OF THE UPPER LEFT EXTREMITY WITHOUT CONTRAST
TECHNIQUE: Multidetector CT imaging of the left shoulder was performed
according to the standard protocol.

[Series 3: soft tissue · axial · 0.57mm/px · z∈[-249,-65]mm · 12 of 110 slices shown, 15 images]
[im 9/110  soft-tissue]
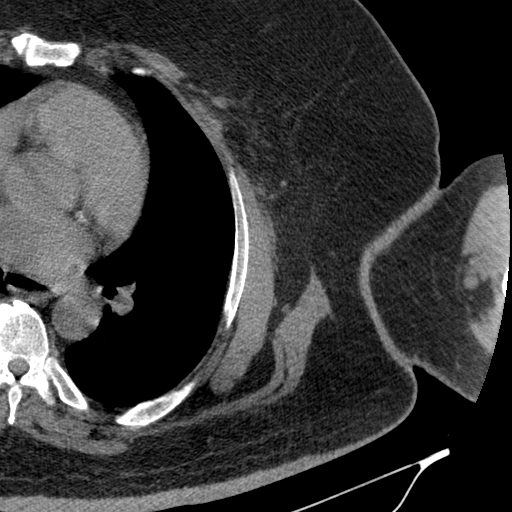
[im 9/110  bone]
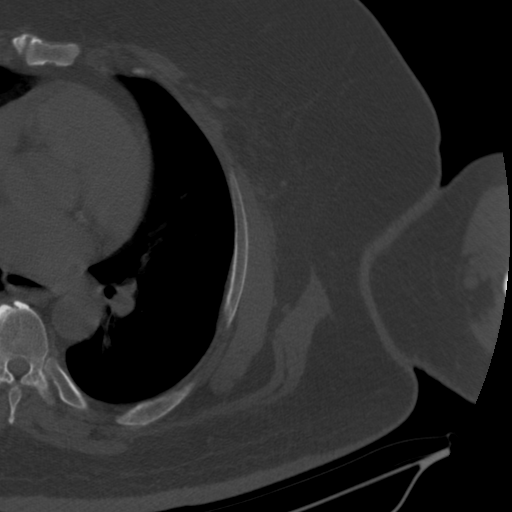
[im 17/110  bone]
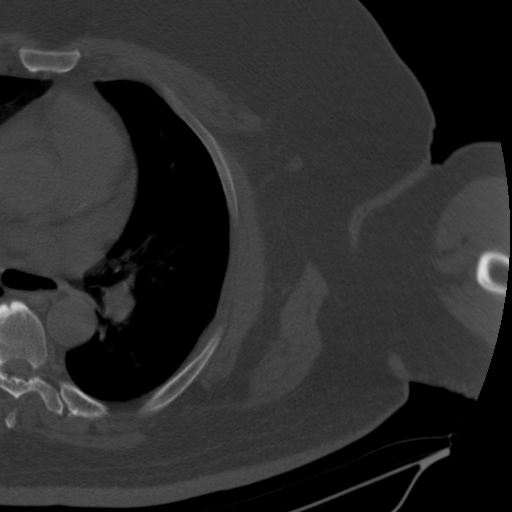
[im 26/110  bone]
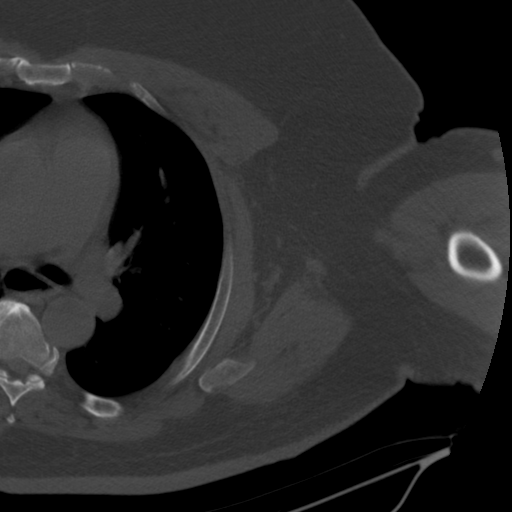
[im 34/110  bone]
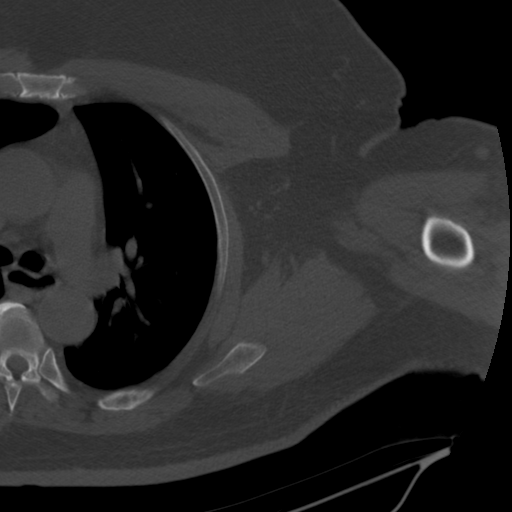
[im 42/110  soft-tissue]
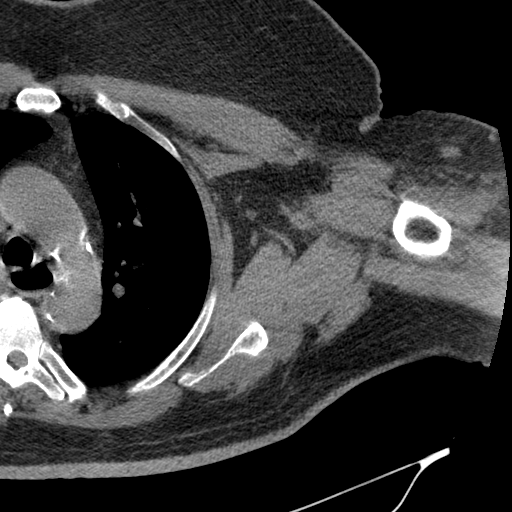
[im 42/110  bone]
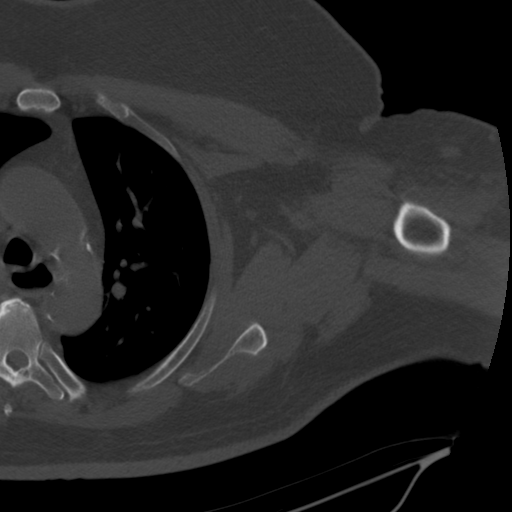
[im 51/110  bone]
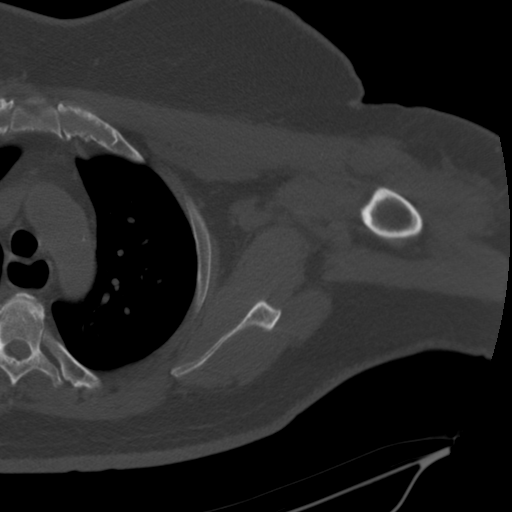
[im 59/110  bone]
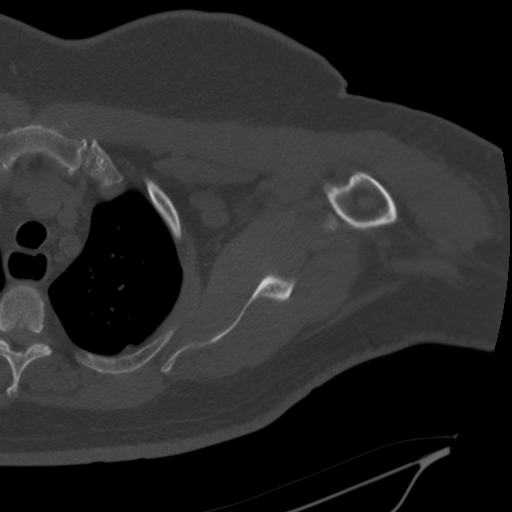
[im 68/110  bone]
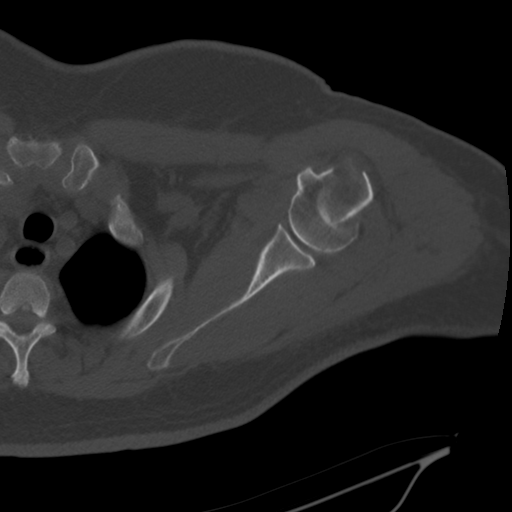
[im 76/110  soft-tissue]
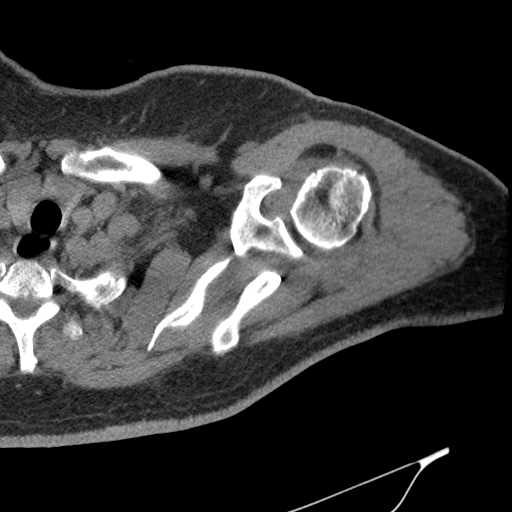
[im 76/110  bone]
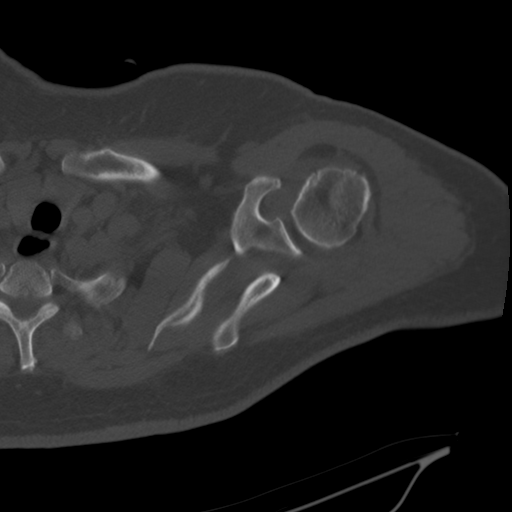
[im 84/110  bone]
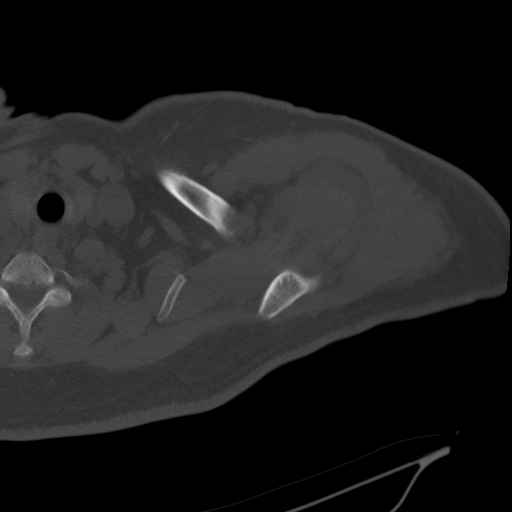
[im 93/110  bone]
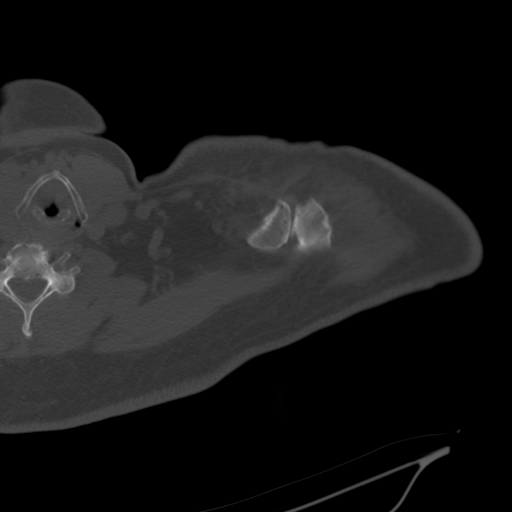
[im 101/110  bone]
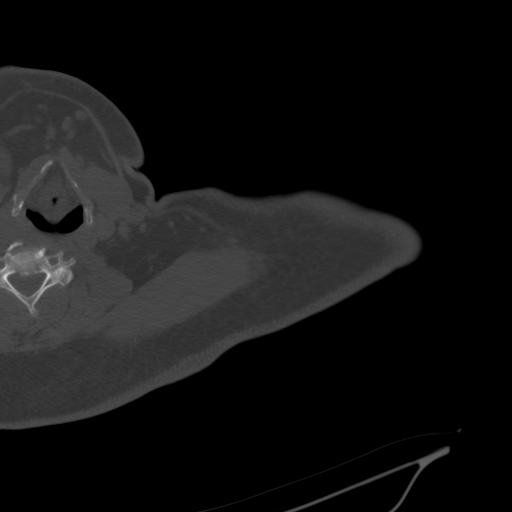

[12 of 14 positions shown; findings below may reference images not displayed]

FINDINGS: Bones/Joint/Cartilage

There is a mildly comminuted fracture through the left humeral neck
with associated impaction and apex anterolateral angulation. There
is superior extension of fracture into the greater tuberosity. The
lesser tuberosity and articular surface of the humeral head are not
involved. The humeral head is located. There is no evidence of
glenoid fracture.

Mild underlying acromioclavicular degenerative changes are present.
There is a small shoulder joint effusion. No significant
glenohumeral arthropathy.

Ligaments

Suboptimally assessed by CT.

Muscles and Tendons

The biceps tendon is not well visualized. No significant rotator
cuff muscular atrophy identified. There is a moderate amount of
fluid in the subacromial-subdeltoid and the subcoracoid bursa.

Soft tissues

No significant periarticular hematoma. No left chest wall injury
identified. There is atherosclerosis of the aorta, great vessels and
coronary arteries.
IMPRESSION: 1. Mildly comminuted, impacted and angulated fracture of the left
humeral neck with extension into the greater tuberosity. No
dislocation, involvement of the humeral head articular surface or
scapular fracture.
2. Small shoulder joint effusion with moderate fluid in the
subacromial-subdeltoid and subcoracoid bursa.

## 2019-02-04 ENCOUNTER — Telehealth: Payer: Self-pay

## 2019-02-04 ENCOUNTER — Telehealth: Payer: Self-pay | Admitting: "Endocrinology

## 2019-02-04 DIAGNOSIS — N183 Chronic kidney disease, stage 3 unspecified: Secondary | ICD-10-CM

## 2019-02-04 DIAGNOSIS — E559 Vitamin D deficiency, unspecified: Secondary | ICD-10-CM

## 2019-02-04 DIAGNOSIS — E782 Mixed hyperlipidemia: Secondary | ICD-10-CM

## 2019-02-04 DIAGNOSIS — I10 Essential (primary) hypertension: Secondary | ICD-10-CM

## 2019-02-04 DIAGNOSIS — E1122 Type 2 diabetes mellitus with diabetic chronic kidney disease: Secondary | ICD-10-CM

## 2019-02-04 NOTE — Telephone Encounter (Signed)
Patient needs updated lab order so [facility] can [facility]

## 2019-02-04 NOTE — Telephone Encounter (Signed)
Done

## 2019-02-04 NOTE — Telephone Encounter (Signed)
LeighAnn Edit Ricciardelli, CMA  

## 2019-02-05 DIAGNOSIS — E113592 Type 2 diabetes mellitus with proliferative diabetic retinopathy without macular edema, left eye: Secondary | ICD-10-CM | POA: Diagnosis not present

## 2019-02-05 DIAGNOSIS — E113511 Type 2 diabetes mellitus with proliferative diabetic retinopathy with macular edema, right eye: Secondary | ICD-10-CM | POA: Diagnosis not present

## 2019-02-10 ENCOUNTER — Other Ambulatory Visit: Payer: Self-pay | Admitting: "Endocrinology

## 2019-02-11 ENCOUNTER — Other Ambulatory Visit: Payer: Self-pay

## 2019-02-11 MED ORDER — INSULIN DEGLUDEC 100 UNIT/ML ~~LOC~~ SOPN: 55.0000 [IU] | PEN_INJECTOR | Freq: Every day | SUBCUTANEOUS | 0 refills | Status: DC

## 2019-02-16 DIAGNOSIS — I10 Essential (primary) hypertension: Secondary | ICD-10-CM | POA: Diagnosis not present

## 2019-02-16 DIAGNOSIS — E1122 Type 2 diabetes mellitus with diabetic chronic kidney disease: Secondary | ICD-10-CM | POA: Diagnosis not present

## 2019-02-16 DIAGNOSIS — N183 Chronic kidney disease, stage 3 (moderate): Secondary | ICD-10-CM | POA: Diagnosis not present

## 2019-02-16 DIAGNOSIS — E782 Mixed hyperlipidemia: Secondary | ICD-10-CM | POA: Diagnosis not present

## 2019-02-17 LAB — COMPLETE METABOLIC PANEL WITH GFR
AG Ratio: 1.6 (calc) (ref 1.0–2.5)
ALT: 12 U/L (ref 9–46)
AST: 17 U/L (ref 10–35)
Albumin: 3.8 g/dL (ref 3.6–5.1)
Alkaline phosphatase (APISO): 78 U/L (ref 35–144)
BUN/Creatinine Ratio: 10 (calc) (ref 6–22)
BUN: 23 mg/dL (ref 7–25)
CO2: 27 mmol/L (ref 20–32)
Calcium: 8.7 mg/dL (ref 8.6–10.3)
Chloride: 109 mmol/L (ref 98–110)
Creat: 2.24 mg/dL — ABNORMAL HIGH (ref 0.70–1.25)
GFR, Est African American: 35 mL/min/{1.73_m2} — ABNORMAL LOW (ref >=60)
GFR, Est Non African American: 30 mL/min/{1.73_m2} — ABNORMAL LOW (ref >=60)
Globulin: 2.4 g/dL (calc) (ref 1.9–3.7)
Glucose, Bld: 93 mg/dL (ref 65–99)
Potassium: 4.2 mmol/L (ref 3.5–5.3)
Sodium: 142 mmol/L (ref 135–146)
Total Bilirubin: 0.5 mg/dL (ref 0.2–1.2)
Total Protein: 6.2 g/dL (ref 6.1–8.1)

## 2019-02-17 LAB — MICROALBUMIN / CREATININE URINE RATIO
Creatinine, Urine: 211 mg/dL (ref 20–320)
Microalb Creat Ratio: 93 mcg/mg creat — ABNORMAL HIGH (ref <30)
Microalb, Ur: 19.7 mg/dL

## 2019-02-17 LAB — HEMOGLOBIN A1C
Hgb A1c MFr Bld: 6.8 % of total Hgb — ABNORMAL HIGH (ref <5.7)
Mean Plasma Glucose: 148 (calc)
eAG (mmol/L): 8.2 (calc)

## 2019-02-17 LAB — T4, FREE: Free T4: 1.2 ng/dL (ref 0.8–1.8)

## 2019-02-17 LAB — TSH: TSH: 1.85 mIU/L (ref 0.40–4.50)

## 2019-02-17 LAB — VITAMIN D 25 HYDROXY (VIT D DEFICIENCY, FRACTURES): Vit D, 25-Hydroxy: 28 ng/mL — ABNORMAL LOW (ref 30–100)

## 2019-02-24 ENCOUNTER — Other Ambulatory Visit: Payer: Self-pay

## 2019-02-24 ENCOUNTER — Ambulatory Visit (INDEPENDENT_AMBULATORY_CARE_PROVIDER_SITE_OTHER): Payer: PPO | Admitting: "Endocrinology

## 2019-02-24 ENCOUNTER — Encounter: Payer: Self-pay | Admitting: "Endocrinology

## 2019-02-24 DIAGNOSIS — I10 Essential (primary) hypertension: Secondary | ICD-10-CM

## 2019-02-24 DIAGNOSIS — E782 Mixed hyperlipidemia: Secondary | ICD-10-CM | POA: Diagnosis not present

## 2019-02-24 DIAGNOSIS — N183 Chronic kidney disease, stage 3 unspecified: Secondary | ICD-10-CM

## 2019-02-24 DIAGNOSIS — E559 Vitamin D deficiency, unspecified: Secondary | ICD-10-CM | POA: Diagnosis not present

## 2019-02-24 DIAGNOSIS — E1122 Type 2 diabetes mellitus with diabetic chronic kidney disease: Secondary | ICD-10-CM

## 2019-02-24 MED ORDER — TRULICITY 1.5 MG/0.5ML ~~LOC~~ SOAJ: SUBCUTANEOUS | 2 refills | Status: DC

## 2019-02-24 MED ORDER — LEVEMIR FLEXTOUCH 100 UNIT/ML ~~LOC~~ SOPN: 55.0000 [IU] | PEN_INJECTOR | Freq: Every day | SUBCUTANEOUS | 0 refills | Status: DC
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
## 2019-03-03 DIAGNOSIS — R339 Retention of urine, unspecified: Secondary | ICD-10-CM | POA: Diagnosis not present

## 2019-03-05 DIAGNOSIS — E113512 Type 2 diabetes mellitus with proliferative diabetic retinopathy with macular edema, left eye: Secondary | ICD-10-CM | POA: Diagnosis not present

## 2019-03-17 DIAGNOSIS — E113512 Type 2 diabetes mellitus with proliferative diabetic retinopathy with macular edema, left eye: Secondary | ICD-10-CM | POA: Diagnosis not present

## 2019-04-13 ENCOUNTER — Ambulatory Visit: Payer: PPO | Admitting: Cardiology

## 2019-04-13 ENCOUNTER — Encounter: Payer: Self-pay | Admitting: Cardiology

## 2019-04-13 VITALS — BP 123/73 | HR 73 | Ht 65.0 in | Wt 276.8 lb

## 2019-04-13 DIAGNOSIS — R0789 Other chest pain: Secondary | ICD-10-CM | POA: Diagnosis not present

## 2019-04-13 DIAGNOSIS — E782 Mixed hyperlipidemia: Secondary | ICD-10-CM

## 2019-04-13 DIAGNOSIS — Z23 Encounter for immunization: Secondary | ICD-10-CM

## 2019-04-13 DIAGNOSIS — I10 Essential (primary) hypertension: Secondary | ICD-10-CM | POA: Diagnosis not present

## 2019-04-13 NOTE — Patient Instructions (Signed)
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
## 2019-04-30 DIAGNOSIS — E113512 Type 2 diabetes mellitus with proliferative diabetic retinopathy with macular edema, left eye: Secondary | ICD-10-CM | POA: Diagnosis not present

## 2019-04-30 DIAGNOSIS — E113591 Type 2 diabetes mellitus with proliferative diabetic retinopathy without macular edema, right eye: Secondary | ICD-10-CM | POA: Diagnosis not present

## 2019-05-10 ENCOUNTER — Other Ambulatory Visit: Payer: Self-pay | Admitting: "Endocrinology

## 2019-05-27 ENCOUNTER — Other Ambulatory Visit: Payer: Self-pay | Admitting: "Endocrinology

## 2019-05-31 DIAGNOSIS — R339 Retention of urine, unspecified: Secondary | ICD-10-CM | POA: Diagnosis not present

## 2019-06-15 DIAGNOSIS — E113512 Type 2 diabetes mellitus with proliferative diabetic retinopathy with macular edema, left eye: Secondary | ICD-10-CM | POA: Diagnosis not present

## 2019-06-15 DIAGNOSIS — E113591 Type 2 diabetes mellitus with proliferative diabetic retinopathy without macular edema, right eye: Secondary | ICD-10-CM | POA: Diagnosis not present

## 2019-06-29 ENCOUNTER — Ambulatory Visit: Payer: PPO | Admitting: "Endocrinology

## 2019-06-29 DIAGNOSIS — E1122 Type 2 diabetes mellitus with diabetic chronic kidney disease: Secondary | ICD-10-CM | POA: Diagnosis not present

## 2019-06-29 DIAGNOSIS — N183 Chronic kidney disease, stage 3 unspecified: Secondary | ICD-10-CM | POA: Diagnosis not present

## 2019-06-30 DIAGNOSIS — B351 Tinea unguium: Secondary | ICD-10-CM | POA: Diagnosis not present

## 2019-06-30 DIAGNOSIS — L600 Ingrowing nail: Secondary | ICD-10-CM | POA: Diagnosis not present

## 2019-06-30 DIAGNOSIS — E1140 Type 2 diabetes mellitus with diabetic neuropathy, unspecified: Secondary | ICD-10-CM | POA: Diagnosis not present

## 2019-06-30 DIAGNOSIS — E1151 Type 2 diabetes mellitus with diabetic peripheral angiopathy without gangrene: Secondary | ICD-10-CM | POA: Diagnosis not present

## 2019-06-30 LAB — COMPLETE METABOLIC PANEL WITH GFR
AG Ratio: 1.5 (calc) (ref 1.0–2.5)
ALT: 15 U/L (ref 9–46)
AST: 17 U/L (ref 10–35)
Albumin: 4 g/dL (ref 3.6–5.1)
Alkaline phosphatase (APISO): 84 U/L (ref 35–144)
BUN/Creatinine Ratio: 12 (calc) (ref 6–22)
BUN: 25 mg/dL (ref 7–25)
CO2: 29 mmol/L (ref 20–32)
Calcium: 9 mg/dL (ref 8.6–10.3)
Chloride: 107 mmol/L (ref 98–110)
Creat: 2.07 mg/dL — ABNORMAL HIGH (ref 0.70–1.25)
GFR, Est African American: 38 mL/min/{1.73_m2} — ABNORMAL LOW (ref >=60)
GFR, Est Non African American: 33 mL/min/{1.73_m2} — ABNORMAL LOW (ref >=60)
Globulin: 2.7 g/dL (calc) (ref 1.9–3.7)
Glucose, Bld: 70 mg/dL (ref 65–99)
Potassium: 4.2 mmol/L (ref 3.5–5.3)
Sodium: 143 mmol/L (ref 135–146)
Total Bilirubin: 0.5 mg/dL (ref 0.2–1.2)
Total Protein: 6.7 g/dL (ref 6.1–8.1)

## 2019-06-30 LAB — HEMOGLOBIN A1C
Hgb A1c MFr Bld: 7.3 % of total Hgb — ABNORMAL HIGH (ref <5.7)
Mean Plasma Glucose: 163 (calc)
eAG (mmol/L): 9 (calc)

## 2019-07-01 DIAGNOSIS — R339 Retention of urine, unspecified: Secondary | ICD-10-CM | POA: Diagnosis not present

## 2019-07-06 ENCOUNTER — Encounter: Payer: Self-pay | Admitting: "Endocrinology

## 2019-07-06 ENCOUNTER — Ambulatory Visit (INDEPENDENT_AMBULATORY_CARE_PROVIDER_SITE_OTHER): Payer: PPO | Admitting: "Endocrinology

## 2019-07-06 DIAGNOSIS — I10 Essential (primary) hypertension: Secondary | ICD-10-CM | POA: Diagnosis not present

## 2019-07-06 DIAGNOSIS — E1122 Type 2 diabetes mellitus with diabetic chronic kidney disease: Secondary | ICD-10-CM

## 2019-07-06 DIAGNOSIS — E782 Mixed hyperlipidemia: Secondary | ICD-10-CM | POA: Diagnosis not present

## 2019-07-06 DIAGNOSIS — N183 Chronic kidney disease, stage 3 unspecified: Secondary | ICD-10-CM | POA: Diagnosis not present

## 2019-07-06 MED ORDER — TRULICITY 1.5 MG/0.5ML ~~LOC~~ SOAJ: 1.5000 mg | SUBCUTANEOUS | 2 refills | Status: DC

## 2019-07-06 MED ORDER — LEVEMIR FLEXTOUCH 100 UNIT/ML ~~LOC~~ SOPN: PEN_INJECTOR | SUBCUTANEOUS | 0 refills | Status: DC
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
## 2019-07-06 NOTE — Patient Instructions (Signed)

## 2019-07-08 DIAGNOSIS — Z23 Encounter for immunization: Secondary | ICD-10-CM | POA: Diagnosis not present

## 2019-07-08 DIAGNOSIS — Z0000 Encounter for general adult medical examination without abnormal findings: Secondary | ICD-10-CM | POA: Diagnosis not present

## 2019-07-08 DIAGNOSIS — Z7289 Other problems related to lifestyle: Secondary | ICD-10-CM | POA: Diagnosis not present

## 2019-07-17 ENCOUNTER — Other Ambulatory Visit: Payer: Self-pay | Admitting: "Endocrinology

## 2019-08-03 DIAGNOSIS — E113512 Type 2 diabetes mellitus with proliferative diabetic retinopathy with macular edema, left eye: Secondary | ICD-10-CM | POA: Diagnosis not present

## 2019-08-03 DIAGNOSIS — E113591 Type 2 diabetes mellitus with proliferative diabetic retinopathy without macular edema, right eye: Secondary | ICD-10-CM | POA: Diagnosis not present

## 2019-08-23 DIAGNOSIS — R339 Retention of urine, unspecified: Secondary | ICD-10-CM | POA: Diagnosis not present

## 2019-08-25 ENCOUNTER — Other Ambulatory Visit: Payer: Self-pay | Admitting: "Endocrinology

## 2019-09-21 DIAGNOSIS — E113591 Type 2 diabetes mellitus with proliferative diabetic retinopathy without macular edema, right eye: Secondary | ICD-10-CM | POA: Diagnosis not present

## 2019-09-21 DIAGNOSIS — E113512 Type 2 diabetes mellitus with proliferative diabetic retinopathy with macular edema, left eye: Secondary | ICD-10-CM | POA: Diagnosis not present

## 2019-10-01 ENCOUNTER — Other Ambulatory Visit: Payer: Self-pay | Admitting: "Endocrinology

## 2019-10-05 ENCOUNTER — Encounter: Payer: Self-pay | Admitting: "Endocrinology

## 2019-10-05 ENCOUNTER — Other Ambulatory Visit: Payer: Self-pay

## 2019-10-05 ENCOUNTER — Ambulatory Visit (INDEPENDENT_AMBULATORY_CARE_PROVIDER_SITE_OTHER): Payer: PPO | Admitting: "Endocrinology

## 2019-10-05 VITALS — BP 157/73 | HR 70 | Ht 65.0 in | Wt 272.2 lb

## 2019-10-05 DIAGNOSIS — E1122 Type 2 diabetes mellitus with diabetic chronic kidney disease: Secondary | ICD-10-CM | POA: Diagnosis not present

## 2019-10-05 DIAGNOSIS — I10 Essential (primary) hypertension: Secondary | ICD-10-CM

## 2019-10-05 DIAGNOSIS — E559 Vitamin D deficiency, unspecified: Secondary | ICD-10-CM

## 2019-10-05 DIAGNOSIS — N183 Chronic kidney disease, stage 3 unspecified: Secondary | ICD-10-CM | POA: Diagnosis not present

## 2019-10-05 DIAGNOSIS — E782 Mixed hyperlipidemia: Secondary | ICD-10-CM

## 2019-10-05 LAB — POCT GLYCOSYLATED HEMOGLOBIN (HGB A1C): Hemoglobin A1C: 6.9 % — AB (ref 4.0–5.6)

## 2019-10-05 NOTE — Patient Instructions (Signed)
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
## 2019-11-12 DIAGNOSIS — E113591 Type 2 diabetes mellitus with proliferative diabetic retinopathy without macular edema, right eye: Secondary | ICD-10-CM | POA: Diagnosis not present

## 2019-11-12 DIAGNOSIS — R339 Retention of urine, unspecified: Secondary | ICD-10-CM | POA: Diagnosis not present

## 2019-11-12 DIAGNOSIS — E113512 Type 2 diabetes mellitus with proliferative diabetic retinopathy with macular edema, left eye: Secondary | ICD-10-CM | POA: Diagnosis not present

## 2019-11-20 ENCOUNTER — Other Ambulatory Visit: Payer: Self-pay | Admitting: "Endocrinology

## 2019-12-02 ENCOUNTER — Other Ambulatory Visit: Payer: Self-pay | Admitting: "Endocrinology

## 2019-12-23 ENCOUNTER — Other Ambulatory Visit: Payer: Self-pay | Admitting: "Endocrinology

## 2019-12-31 DIAGNOSIS — H31091 Other chorioretinal scars, right eye: Secondary | ICD-10-CM | POA: Diagnosis not present

## 2019-12-31 DIAGNOSIS — E113513 Type 2 diabetes mellitus with proliferative diabetic retinopathy with macular edema, bilateral: Secondary | ICD-10-CM | POA: Diagnosis not present

## 2020-02-04 ENCOUNTER — Ambulatory Visit: Payer: PPO | Admitting: "Endocrinology

## 2020-02-04 DIAGNOSIS — N183 Chronic kidney disease, stage 3 unspecified: Secondary | ICD-10-CM | POA: Diagnosis not present

## 2020-02-04 DIAGNOSIS — E1122 Type 2 diabetes mellitus with diabetic chronic kidney disease: Secondary | ICD-10-CM | POA: Diagnosis not present

## 2020-02-05 LAB — COMPLETE METABOLIC PANEL WITH GFR
AG Ratio: 1.5 (calc) (ref 1.0–2.5)
ALT: 14 U/L (ref 9–46)
AST: 18 U/L (ref 10–35)
Albumin: 3.8 g/dL (ref 3.6–5.1)
Alkaline phosphatase (APISO): 80 U/L (ref 35–144)
BUN/Creatinine Ratio: 10 (calc) (ref 6–22)
BUN: 20 mg/dL (ref 7–25)
CO2: 26 mmol/L (ref 20–32)
Calcium: 8.8 mg/dL (ref 8.6–10.3)
Chloride: 110 mmol/L (ref 98–110)
Creat: 2 mg/dL — ABNORMAL HIGH (ref 0.70–1.25)
GFR, Est African American: 39 mL/min/{1.73_m2} — ABNORMAL LOW (ref >=60)
GFR, Est Non African American: 34 mL/min/{1.73_m2} — ABNORMAL LOW (ref >=60)
Globulin: 2.5 g/dL (calc) (ref 1.9–3.7)
Glucose, Bld: 102 mg/dL — ABNORMAL HIGH (ref 65–99)
Potassium: 4.6 mmol/L (ref 3.5–5.3)
Sodium: 145 mmol/L (ref 135–146)
Total Bilirubin: 0.6 mg/dL (ref 0.2–1.2)
Total Protein: 6.3 g/dL (ref 6.1–8.1)

## 2020-02-05 LAB — LIPID PANEL
Cholesterol: 115 mg/dL (ref <200)
HDL: 42 mg/dL (ref >=40)
LDL Cholesterol (Calc): 60 mg/dL (calc)
Non-HDL Cholesterol (Calc): 73 mg/dL (calc) (ref <130)
Total CHOL/HDL Ratio: 2.7 (calc) (ref <5.0)
Triglycerides: 58 mg/dL (ref <150)

## 2020-02-05 LAB — T4, FREE: Free T4: 1.2 ng/dL (ref 0.8–1.8)

## 2020-02-05 LAB — MICROALBUMIN / CREATININE URINE RATIO
Creatinine, Urine: 232 mg/dL (ref 20–320)
Microalb Creat Ratio: 163 mcg/mg creat — ABNORMAL HIGH (ref <30)
Microalb, Ur: 37.9 mg/dL

## 2020-02-05 LAB — TSH: TSH: 0.5 mIU/L (ref 0.40–4.50)

## 2020-02-05 LAB — VITAMIN D 25 HYDROXY (VIT D DEFICIENCY, FRACTURES): Vit D, 25-Hydroxy: 31 ng/mL (ref 30–100)

## 2020-02-10 ENCOUNTER — Ambulatory Visit: Payer: PPO | Admitting: "Endocrinology

## 2020-02-10 ENCOUNTER — Encounter: Payer: Self-pay | Admitting: "Endocrinology

## 2020-02-10 ENCOUNTER — Other Ambulatory Visit: Payer: Self-pay

## 2020-02-10 VITALS — BP 120/72 | HR 80 | Ht 65.0 in | Wt 266.6 lb

## 2020-02-10 DIAGNOSIS — N183 Type 2 diabetes mellitus with diabetic chronic kidney disease: Secondary | ICD-10-CM

## 2020-02-10 DIAGNOSIS — E1122 Type 2 diabetes mellitus with diabetic chronic kidney disease: Secondary | ICD-10-CM

## 2020-02-10 DIAGNOSIS — E559 Vitamin D deficiency, unspecified: Secondary | ICD-10-CM

## 2020-02-10 DIAGNOSIS — E782 Mixed hyperlipidemia: Secondary | ICD-10-CM

## 2020-02-10 DIAGNOSIS — I10 Essential (primary) hypertension: Secondary | ICD-10-CM

## 2020-02-10 LAB — POCT GLYCOSYLATED HEMOGLOBIN (HGB A1C): Hemoglobin A1C: 6.3 % — AB (ref 4.0–5.6)

## 2020-02-10 MED ORDER — LEVEMIR FLEXTOUCH 100 UNIT/ML ~~LOC~~ SOPN: 50.0000 [IU] | PEN_INJECTOR | Freq: Every day | SUBCUTANEOUS | 2 refills | Status: DC

## 2020-02-10 NOTE — Patient Instructions (Signed)
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
## 2020-02-22 DIAGNOSIS — H31091 Other chorioretinal scars, right eye: Secondary | ICD-10-CM | POA: Diagnosis not present

## 2020-02-22 DIAGNOSIS — E113512 Type 2 diabetes mellitus with proliferative diabetic retinopathy with macular edema, left eye: Secondary | ICD-10-CM | POA: Diagnosis not present

## 2020-02-22 DIAGNOSIS — E113591 Type 2 diabetes mellitus with proliferative diabetic retinopathy without macular edema, right eye: Secondary | ICD-10-CM | POA: Diagnosis not present

## 2020-02-24 DIAGNOSIS — L600 Ingrowing nail: Secondary | ICD-10-CM | POA: Diagnosis not present

## 2020-02-24 DIAGNOSIS — E1151 Type 2 diabetes mellitus with diabetic peripheral angiopathy without gangrene: Secondary | ICD-10-CM | POA: Diagnosis not present

## 2020-02-24 DIAGNOSIS — E1140 Type 2 diabetes mellitus with diabetic neuropathy, unspecified: Secondary | ICD-10-CM | POA: Diagnosis not present

## 2020-02-24 DIAGNOSIS — B351 Tinea unguium: Secondary | ICD-10-CM | POA: Diagnosis not present

## 2020-03-17 ENCOUNTER — Other Ambulatory Visit: Payer: Self-pay | Admitting: "Endocrinology

## 2020-04-11 DIAGNOSIS — E113512 Type 2 diabetes mellitus with proliferative diabetic retinopathy with macular edema, left eye: Secondary | ICD-10-CM | POA: Diagnosis not present

## 2020-04-11 DIAGNOSIS — E113591 Type 2 diabetes mellitus with proliferative diabetic retinopathy without macular edema, right eye: Secondary | ICD-10-CM | POA: Diagnosis not present

## 2020-05-02 DIAGNOSIS — Z23 Encounter for immunization: Secondary | ICD-10-CM | POA: Diagnosis not present

## 2020-05-05 DIAGNOSIS — R339 Retention of urine, unspecified: Secondary | ICD-10-CM | POA: Diagnosis not present

## 2020-05-30 DIAGNOSIS — E113591 Type 2 diabetes mellitus with proliferative diabetic retinopathy without macular edema, right eye: Secondary | ICD-10-CM | POA: Diagnosis not present

## 2020-05-30 DIAGNOSIS — E113512 Type 2 diabetes mellitus with proliferative diabetic retinopathy with macular edema, left eye: Secondary | ICD-10-CM | POA: Diagnosis not present

## 2020-06-08 ENCOUNTER — Other Ambulatory Visit: Payer: Self-pay | Admitting: "Endocrinology

## 2020-06-12 ENCOUNTER — Encounter: Payer: Self-pay | Admitting: Nurse Practitioner

## 2020-06-12 ENCOUNTER — Other Ambulatory Visit: Payer: Self-pay

## 2020-06-12 ENCOUNTER — Ambulatory Visit (INDEPENDENT_AMBULATORY_CARE_PROVIDER_SITE_OTHER): Payer: PPO | Admitting: Nurse Practitioner

## 2020-06-12 VITALS — BP 125/69 | HR 72 | Ht 65.0 in | Wt 271.0 lb

## 2020-06-12 DIAGNOSIS — I10 Essential (primary) hypertension: Secondary | ICD-10-CM | POA: Diagnosis not present

## 2020-06-12 DIAGNOSIS — E782 Mixed hyperlipidemia: Secondary | ICD-10-CM | POA: Diagnosis not present

## 2020-06-12 DIAGNOSIS — E559 Vitamin D deficiency, unspecified: Secondary | ICD-10-CM | POA: Diagnosis not present

## 2020-06-12 DIAGNOSIS — E1122 Type 2 diabetes mellitus with diabetic chronic kidney disease: Secondary | ICD-10-CM | POA: Diagnosis not present

## 2020-06-12 DIAGNOSIS — N183 Chronic kidney disease, stage 3 unspecified: Secondary | ICD-10-CM | POA: Diagnosis not present

## 2020-06-12 LAB — POCT GLYCOSYLATED HEMOGLOBIN (HGB A1C): HbA1c, POC (controlled diabetic range): 6.8 % (ref 0.0–7.0)

## 2020-06-12 NOTE — Patient Instructions (Signed)
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
## 2020-06-20 DIAGNOSIS — R339 Retention of urine, unspecified: Secondary | ICD-10-CM | POA: Diagnosis not present

## 2020-06-22 DIAGNOSIS — L600 Ingrowing nail: Secondary | ICD-10-CM | POA: Diagnosis not present

## 2020-06-22 DIAGNOSIS — B351 Tinea unguium: Secondary | ICD-10-CM | POA: Diagnosis not present

## 2020-06-22 DIAGNOSIS — E1140 Type 2 diabetes mellitus with diabetic neuropathy, unspecified: Secondary | ICD-10-CM | POA: Diagnosis not present

## 2020-06-22 DIAGNOSIS — E1151 Type 2 diabetes mellitus with diabetic peripheral angiopathy without gangrene: Secondary | ICD-10-CM | POA: Diagnosis not present

## 2020-07-06 ENCOUNTER — Other Ambulatory Visit: Payer: Self-pay | Admitting: "Endocrinology
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
## 2020-07-10 ENCOUNTER — Encounter: Payer: Self-pay | Admitting: Family Medicine

## 2020-07-10 ENCOUNTER — Ambulatory Visit (INDEPENDENT_AMBULATORY_CARE_PROVIDER_SITE_OTHER): Payer: PPO | Admitting: Family Medicine

## 2020-07-10 VITALS — BP 152/72 | HR 70 | Ht 65.0 in | Wt 273.0 lb

## 2020-07-10 DIAGNOSIS — E782 Mixed hyperlipidemia: Secondary | ICD-10-CM

## 2020-07-10 DIAGNOSIS — R0789 Other chest pain: Secondary | ICD-10-CM | POA: Diagnosis not present

## 2020-07-10 DIAGNOSIS — I10 Essential (primary) hypertension: Secondary | ICD-10-CM

## 2020-07-10 NOTE — Patient Instructions (Signed)

## 2020-07-12 DIAGNOSIS — Z7182 Exercise counseling: Secondary | ICD-10-CM | POA: Diagnosis not present

## 2020-07-12 DIAGNOSIS — Z125 Encounter for screening for malignant neoplasm of prostate: Secondary | ICD-10-CM | POA: Diagnosis not present

## 2020-07-12 DIAGNOSIS — E118 Type 2 diabetes mellitus with unspecified complications: Secondary | ICD-10-CM | POA: Diagnosis not present

## 2020-07-12 DIAGNOSIS — Z794 Long term (current) use of insulin: Secondary | ICD-10-CM | POA: Diagnosis not present

## 2020-07-12 DIAGNOSIS — Z0000 Encounter for general adult medical examination without abnormal findings: Secondary | ICD-10-CM | POA: Diagnosis not present

## 2020-07-12 DIAGNOSIS — Z23 Encounter for immunization: Secondary | ICD-10-CM | POA: Diagnosis not present

## 2020-07-12 LAB — BASIC METABOLIC PANEL
BUN: 23 — AB (ref 4–21)
CO2: 26 — AB (ref 13–22)
Chloride: 105 (ref 99–108)
Creatinine: 1.9 — AB (ref 0.6–1.3)
Glucose: 91
Potassium: 4.3 (ref 3.4–5.3)
Sodium: 142 (ref 137–147)

## 2020-07-12 LAB — HEPATIC FUNCTION PANEL
AST: 23 (ref 14–40)
Alkaline Phosphatase: 92 (ref 25–125)
Bilirubin, Total: 0.5

## 2020-07-12 LAB — COMPREHENSIVE METABOLIC PANEL
Albumin: 3.9 (ref 3.5–5.0)
Calcium: 8.7 (ref 8.7–10.7)
GFR calc Af Amer: 42
GFR calc non Af Amer: 36
Globulin: 2.5

## 2020-07-18 DIAGNOSIS — E113591 Type 2 diabetes mellitus with proliferative diabetic retinopathy without macular edema, right eye: Secondary | ICD-10-CM | POA: Diagnosis not present

## 2020-07-18 DIAGNOSIS — E113512 Type 2 diabetes mellitus with proliferative diabetic retinopathy with macular edema, left eye: Secondary | ICD-10-CM | POA: Diagnosis not present

## 2020-08-01 ENCOUNTER — Ambulatory Visit: Payer: PPO | Admitting: Cardiology

## 2020-08-24 ENCOUNTER — Ambulatory Visit: Payer: PPO | Admitting: Cardiology

## 2020-08-31 ENCOUNTER — Other Ambulatory Visit: Payer: Self-pay | Admitting: "Endocrinology

## 2020-08-31 DIAGNOSIS — R339 Retention of urine, unspecified: Secondary | ICD-10-CM | POA: Diagnosis not present

## 2020-09-05 DIAGNOSIS — E113513 Type 2 diabetes mellitus with proliferative diabetic retinopathy with macular edema, bilateral: Secondary | ICD-10-CM | POA: Diagnosis not present

## 2020-09-05 DIAGNOSIS — H26493 Other secondary cataract, bilateral: Secondary | ICD-10-CM | POA: Diagnosis not present

## 2020-10-02 DIAGNOSIS — E1122 Type 2 diabetes mellitus with diabetic chronic kidney disease: Secondary | ICD-10-CM | POA: Diagnosis not present

## 2020-10-02 DIAGNOSIS — N183 Chronic kidney disease, stage 3 unspecified: Secondary | ICD-10-CM | POA: Diagnosis not present

## 2020-10-03 LAB — COMPREHENSIVE METABOLIC PANEL
ALT: 19 IU/L (ref 0–44)
AST: 24 IU/L (ref 0–40)
Albumin/Globulin Ratio: 1.7 (ref 1.2–2.2)
Albumin: 4.1 g/dL (ref 3.8–4.8)
Alkaline Phosphatase: 99 IU/L (ref 44–121)
BUN/Creatinine Ratio: 10 (ref 10–24)
BUN: 22 mg/dL (ref 8–27)
Bilirubin Total: 0.4 mg/dL (ref 0.0–1.2)
CO2: 24 mmol/L (ref 20–29)
Calcium: 8.7 mg/dL (ref 8.6–10.2)
Chloride: 106 mmol/L (ref 96–106)
Creatinine, Ser: 2.11 mg/dL — ABNORMAL HIGH (ref 0.76–1.27)
Globulin, Total: 2.4 g/dL (ref 1.5–4.5)
Glucose: 136 mg/dL — ABNORMAL HIGH (ref 65–99)
Potassium: 4.1 mmol/L (ref 3.5–5.2)
Sodium: 145 mmol/L — ABNORMAL HIGH (ref 134–144)
Total Protein: 6.5 g/dL (ref 6.0–8.5)
eGFR: 34 mL/min/{1.73_m2} — ABNORMAL LOW (ref >59)

## 2020-10-10 ENCOUNTER — Ambulatory Visit: Payer: PPO | Admitting: Nurse Practitioner

## 2020-10-10 ENCOUNTER — Encounter: Payer: Self-pay | Admitting: Nurse Practitioner

## 2020-10-10 ENCOUNTER — Other Ambulatory Visit: Payer: Self-pay

## 2020-10-10 VITALS — BP 125/74 | HR 71 | Ht 65.0 in | Wt 263.8 lb

## 2020-10-10 DIAGNOSIS — E559 Vitamin D deficiency, unspecified: Secondary | ICD-10-CM

## 2020-10-10 DIAGNOSIS — E1122 Type 2 diabetes mellitus with diabetic chronic kidney disease: Secondary | ICD-10-CM | POA: Diagnosis not present

## 2020-10-10 DIAGNOSIS — E782 Mixed hyperlipidemia: Secondary | ICD-10-CM | POA: Diagnosis not present

## 2020-10-10 DIAGNOSIS — N183 Chronic kidney disease, stage 3 unspecified: Secondary | ICD-10-CM

## 2020-10-10 DIAGNOSIS — I10 Essential (primary) hypertension: Secondary | ICD-10-CM | POA: Diagnosis not present

## 2020-10-10 LAB — POCT GLYCOSYLATED HEMOGLOBIN (HGB A1C): HbA1c, POC (controlled diabetic range): 6.8 % (ref 0.0–7.0)

## 2020-10-10 MED ORDER — LEVEMIR FLEXTOUCH 100 UNIT/ML ~~LOC~~ SOPN: 55.0000 [IU] | PEN_INJECTOR | Freq: Every day | SUBCUTANEOUS | 3 refills | Status: DC

## 2020-10-10 MED ORDER — TRULICITY 1.5 MG/0.5ML ~~LOC~~ SOAJ: SUBCUTANEOUS | 2 refills | Status: DC
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
## 2020-10-10 NOTE — Patient Instructions (Signed)

## 2020-10-19 DIAGNOSIS — R339 Retention of urine, unspecified: Secondary | ICD-10-CM | POA: Diagnosis not present

## 2020-10-24 DIAGNOSIS — E113591 Type 2 diabetes mellitus with proliferative diabetic retinopathy without macular edema, right eye: Secondary | ICD-10-CM | POA: Diagnosis not present

## 2020-10-24 DIAGNOSIS — H31091 Other chorioretinal scars, right eye: Secondary | ICD-10-CM | POA: Diagnosis not present

## 2020-10-24 DIAGNOSIS — H26493 Other secondary cataract, bilateral: Secondary | ICD-10-CM | POA: Diagnosis not present

## 2020-10-24 DIAGNOSIS — E113512 Type 2 diabetes mellitus with proliferative diabetic retinopathy with macular edema, left eye: Secondary | ICD-10-CM | POA: Diagnosis not present

## 2020-11-29 DIAGNOSIS — E1151 Type 2 diabetes mellitus with diabetic peripheral angiopathy without gangrene: Secondary | ICD-10-CM | POA: Diagnosis not present

## 2020-11-29 DIAGNOSIS — E1140 Type 2 diabetes mellitus with diabetic neuropathy, unspecified: Secondary | ICD-10-CM | POA: Diagnosis not present

## 2020-11-29 DIAGNOSIS — L600 Ingrowing nail: Secondary | ICD-10-CM | POA: Diagnosis not present

## 2020-11-29 DIAGNOSIS — B351 Tinea unguium: Secondary | ICD-10-CM | POA: Diagnosis not present

## 2020-12-04 DIAGNOSIS — Z23 Encounter for immunization: Secondary | ICD-10-CM | POA: Diagnosis not present

## 2020-12-13 DIAGNOSIS — E113512 Type 2 diabetes mellitus with proliferative diabetic retinopathy with macular edema, left eye: Secondary | ICD-10-CM | POA: Diagnosis not present

## 2020-12-13 DIAGNOSIS — E113591 Type 2 diabetes mellitus with proliferative diabetic retinopathy without macular edema, right eye: Secondary | ICD-10-CM | POA: Diagnosis not present

## 2020-12-19 ENCOUNTER — Other Ambulatory Visit: Payer: Self-pay | Admitting: Nurse Practitioner

## 2020-12-29 DIAGNOSIS — R339 Retention of urine, unspecified: Secondary | ICD-10-CM | POA: Diagnosis not present

## 2021-01-04 DIAGNOSIS — R200 Anesthesia of skin: Secondary | ICD-10-CM | POA: Diagnosis not present

## 2021-01-04 DIAGNOSIS — G4730 Sleep apnea, unspecified: Secondary | ICD-10-CM | POA: Diagnosis not present

## 2021-01-04 DIAGNOSIS — E119 Type 2 diabetes mellitus without complications: Secondary | ICD-10-CM | POA: Diagnosis not present

## 2021-01-04 DIAGNOSIS — I10 Essential (primary) hypertension: Secondary | ICD-10-CM | POA: Diagnosis not present

## 2021-01-04 DIAGNOSIS — G629 Polyneuropathy, unspecified: Secondary | ICD-10-CM | POA: Diagnosis not present

## 2021-01-30 DIAGNOSIS — H31091 Other chorioretinal scars, right eye: Secondary | ICD-10-CM | POA: Diagnosis not present

## 2021-01-30 DIAGNOSIS — E113512 Type 2 diabetes mellitus with proliferative diabetic retinopathy with macular edema, left eye: Secondary | ICD-10-CM | POA: Diagnosis not present

## 2021-01-30 DIAGNOSIS — E113591 Type 2 diabetes mellitus with proliferative diabetic retinopathy without macular edema, right eye: Secondary | ICD-10-CM | POA: Diagnosis not present

## 2021-02-02 DIAGNOSIS — E559 Vitamin D deficiency, unspecified: Secondary | ICD-10-CM | POA: Diagnosis not present

## 2021-02-02 DIAGNOSIS — N183 Chronic kidney disease, stage 3 unspecified: Secondary | ICD-10-CM | POA: Diagnosis not present

## 2021-02-02 DIAGNOSIS — E1122 Type 2 diabetes mellitus with diabetic chronic kidney disease: Secondary | ICD-10-CM | POA: Diagnosis not present

## 2021-02-03 LAB — COMPREHENSIVE METABOLIC PANEL
ALT: 13 IU/L (ref 0–44)
AST: 20 IU/L (ref 0–40)
Albumin/Globulin Ratio: 1.7 (ref 1.2–2.2)
Albumin: 4 g/dL (ref 3.8–4.8)
Alkaline Phosphatase: 87 IU/L (ref 44–121)
BUN/Creatinine Ratio: 10 (ref 10–24)
BUN: 21 mg/dL (ref 8–27)
Bilirubin Total: 0.6 mg/dL (ref 0.0–1.2)
CO2: 23 mmol/L (ref 20–29)
Calcium: 8.9 mg/dL (ref 8.6–10.2)
Chloride: 104 mmol/L (ref 96–106)
Creatinine, Ser: 2.03 mg/dL — ABNORMAL HIGH (ref 0.76–1.27)
Globulin, Total: 2.4 g/dL (ref 1.5–4.5)
Glucose: 83 mg/dL (ref 65–99)
Potassium: 3.9 mmol/L (ref 3.5–5.2)
Sodium: 140 mmol/L (ref 134–144)
Total Protein: 6.4 g/dL (ref 6.0–8.5)
eGFR: 35 mL/min/{1.73_m2} — ABNORMAL LOW (ref >59)

## 2021-02-03 LAB — LIPID PANEL
Chol/HDL Ratio: 3.1 ratio (ref 0.0–5.0)
Cholesterol, Total: 135 mg/dL (ref 100–199)
HDL: 44 mg/dL
LDL Chol Calc (NIH): 77 mg/dL (ref 0–99)
Triglycerides: 72 mg/dL (ref 0–149)
VLDL Cholesterol Cal: 14 mg/dL (ref 5–40)

## 2021-02-03 LAB — VITAMIN D 25 HYDROXY (VIT D DEFICIENCY, FRACTURES): Vit D, 25-Hydroxy: 34.1 ng/mL (ref 30.0–100.0)

## 2021-02-03 LAB — TSH: TSH: 1.31 u[IU]/mL (ref 0.450–4.500)

## 2021-02-03 LAB — T4, FREE: Free T4: 1.27 ng/dL (ref 0.82–1.77)

## 2021-02-08 NOTE — Patient Instructions (Signed)

## 2021-02-09 ENCOUNTER — Encounter: Payer: Self-pay | Admitting: Nurse Practitioner

## 2021-02-09 ENCOUNTER — Other Ambulatory Visit: Payer: Self-pay

## 2021-02-09 ENCOUNTER — Ambulatory Visit: Payer: PPO | Admitting: Nurse Practitioner

## 2021-02-09 VITALS — BP 148/79 | HR 74 | Ht 65.0 in | Wt 256.4 lb

## 2021-02-09 DIAGNOSIS — I10 Essential (primary) hypertension: Secondary | ICD-10-CM

## 2021-02-09 DIAGNOSIS — N183 Chronic kidney disease, stage 3 unspecified: Secondary | ICD-10-CM

## 2021-02-09 DIAGNOSIS — E559 Vitamin D deficiency, unspecified: Secondary | ICD-10-CM

## 2021-02-09 DIAGNOSIS — E1122 Type 2 diabetes mellitus with diabetic chronic kidney disease: Secondary | ICD-10-CM

## 2021-02-09 DIAGNOSIS — E782 Mixed hyperlipidemia: Secondary | ICD-10-CM | POA: Diagnosis not present

## 2021-02-09 LAB — POCT UA - MICROALBUMIN
Creatinine, POC: 300 mg/dL
Microalbumin Ur, POC: 150 mg/L

## 2021-02-09 LAB — POCT GLYCOSYLATED HEMOGLOBIN (HGB A1C): HbA1c, POC (controlled diabetic range): 6.1 % (ref 0.0–7.0)
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
## 2021-02-22 DIAGNOSIS — E1142 Type 2 diabetes mellitus with diabetic polyneuropathy: Secondary | ICD-10-CM | POA: Diagnosis not present

## 2021-02-22 DIAGNOSIS — G5601 Carpal tunnel syndrome, right upper limb: Secondary | ICD-10-CM | POA: Diagnosis not present

## 2021-02-22 DIAGNOSIS — G5602 Carpal tunnel syndrome, left upper limb: Secondary | ICD-10-CM | POA: Diagnosis not present

## 2021-03-09 DIAGNOSIS — R339 Retention of urine, unspecified: Secondary | ICD-10-CM | POA: Diagnosis not present

## 2021-03-09 DIAGNOSIS — Z8546 Personal history of malignant neoplasm of prostate: Secondary | ICD-10-CM | POA: Diagnosis not present

## 2021-03-10 ENCOUNTER — Other Ambulatory Visit: Payer: Self-pay | Admitting: "Endocrinology

## 2021-03-20 DIAGNOSIS — E113513 Type 2 diabetes mellitus with proliferative diabetic retinopathy with macular edema, bilateral: Secondary | ICD-10-CM | POA: Diagnosis not present

## 2021-03-20 DIAGNOSIS — H31093 Other chorioretinal scars, bilateral: Secondary | ICD-10-CM | POA: Diagnosis not present

## 2021-03-20 DIAGNOSIS — E113512 Type 2 diabetes mellitus with proliferative diabetic retinopathy with macular edema, left eye: Secondary | ICD-10-CM | POA: Diagnosis not present

## 2021-03-22 DIAGNOSIS — R202 Paresthesia of skin: Secondary | ICD-10-CM | POA: Diagnosis not present

## 2021-03-22 DIAGNOSIS — R200 Anesthesia of skin: Secondary | ICD-10-CM | POA: Diagnosis not present

## 2021-03-22 DIAGNOSIS — G5601 Carpal tunnel syndrome, right upper limb: Secondary | ICD-10-CM | POA: Diagnosis not present

## 2021-03-22 DIAGNOSIS — M25552 Pain in left hip: Secondary | ICD-10-CM | POA: Diagnosis not present

## 2021-03-22 DIAGNOSIS — M25532 Pain in left wrist: Secondary | ICD-10-CM | POA: Diagnosis not present

## 2021-03-22 DIAGNOSIS — M25531 Pain in right wrist: Secondary | ICD-10-CM | POA: Diagnosis not present

## 2021-04-09 ENCOUNTER — Other Ambulatory Visit: Payer: Self-pay | Admitting: "Endocrinology

## 2021-04-09 DIAGNOSIS — Z01818 Encounter for other preprocedural examination: Secondary | ICD-10-CM | POA: Diagnosis not present

## 2021-04-11 DIAGNOSIS — G5601 Carpal tunnel syndrome, right upper limb: Secondary | ICD-10-CM | POA: Diagnosis not present

## 2021-05-01 DIAGNOSIS — G5601 Carpal tunnel syndrome, right upper limb: Secondary | ICD-10-CM | POA: Diagnosis not present

## 2021-05-01 DIAGNOSIS — I130 Hypertensive heart and chronic kidney disease with heart failure and stage 1 through stage 4 chronic kidney disease, or unspecified chronic kidney disease: Secondary | ICD-10-CM | POA: Diagnosis not present

## 2021-05-01 DIAGNOSIS — E785 Hyperlipidemia, unspecified: Secondary | ICD-10-CM | POA: Diagnosis not present

## 2021-05-01 DIAGNOSIS — E7800 Pure hypercholesterolemia, unspecified: Secondary | ICD-10-CM | POA: Diagnosis not present

## 2021-05-01 DIAGNOSIS — I509 Heart failure, unspecified: Secondary | ICD-10-CM | POA: Diagnosis not present

## 2021-05-01 DIAGNOSIS — N189 Chronic kidney disease, unspecified: Secondary | ICD-10-CM | POA: Diagnosis not present

## 2021-05-01 DIAGNOSIS — G4730 Sleep apnea, unspecified: Secondary | ICD-10-CM | POA: Diagnosis not present

## 2021-05-01 DIAGNOSIS — E1122 Type 2 diabetes mellitus with diabetic chronic kidney disease: Secondary | ICD-10-CM | POA: Diagnosis not present

## 2021-05-01 DIAGNOSIS — E1143 Type 2 diabetes mellitus with diabetic autonomic (poly)neuropathy: Secondary | ICD-10-CM | POA: Diagnosis not present

## 2021-05-01 DIAGNOSIS — Z794 Long term (current) use of insulin: Secondary | ICD-10-CM | POA: Diagnosis not present

## 2021-05-01 DIAGNOSIS — E1140 Type 2 diabetes mellitus with diabetic neuropathy, unspecified: Secondary | ICD-10-CM | POA: Diagnosis not present

## 2021-05-03 DIAGNOSIS — Z01818 Encounter for other preprocedural examination: Secondary | ICD-10-CM | POA: Diagnosis not present

## 2021-05-09 DIAGNOSIS — H31093 Other chorioretinal scars, bilateral: Secondary | ICD-10-CM | POA: Diagnosis not present

## 2021-05-09 DIAGNOSIS — E113513 Type 2 diabetes mellitus with proliferative diabetic retinopathy with macular edema, bilateral: Secondary | ICD-10-CM | POA: Diagnosis not present

## 2021-05-11 DIAGNOSIS — Z23 Encounter for immunization: Secondary | ICD-10-CM | POA: Diagnosis not present

## 2021-05-17 HISTORY — PX: CARPAL TUNNEL RELEASE: SHX101

## 2021-05-24 DIAGNOSIS — B351 Tinea unguium: Secondary | ICD-10-CM | POA: Diagnosis not present

## 2021-05-24 DIAGNOSIS — E1140 Type 2 diabetes mellitus with diabetic neuropathy, unspecified: Secondary | ICD-10-CM | POA: Diagnosis not present

## 2021-05-24 DIAGNOSIS — E1151 Type 2 diabetes mellitus with diabetic peripheral angiopathy without gangrene: Secondary | ICD-10-CM | POA: Diagnosis not present

## 2021-05-24 DIAGNOSIS — L600 Ingrowing nail: Secondary | ICD-10-CM | POA: Diagnosis not present

## 2021-06-11 ENCOUNTER — Other Ambulatory Visit: Payer: Self-pay | Admitting: "Endocrinology

## 2021-06-13 ENCOUNTER — Encounter: Payer: Self-pay | Admitting: Nurse Practitioner

## 2021-06-13 ENCOUNTER — Other Ambulatory Visit: Payer: Self-pay

## 2021-06-13 ENCOUNTER — Ambulatory Visit: Payer: PPO | Admitting: Nurse Practitioner

## 2021-06-13 VITALS — BP 122/68 | HR 78 | Ht 65.0 in | Wt 264.0 lb

## 2021-06-13 DIAGNOSIS — E1122 Type 2 diabetes mellitus with diabetic chronic kidney disease: Secondary | ICD-10-CM | POA: Diagnosis not present

## 2021-06-13 DIAGNOSIS — N183 Chronic kidney disease, stage 3 unspecified: Secondary | ICD-10-CM | POA: Diagnosis not present

## 2021-06-13 DIAGNOSIS — E782 Mixed hyperlipidemia: Secondary | ICD-10-CM

## 2021-06-13 DIAGNOSIS — I10 Essential (primary) hypertension: Secondary | ICD-10-CM

## 2021-06-13 DIAGNOSIS — E559 Vitamin D deficiency, unspecified: Secondary | ICD-10-CM | POA: Diagnosis not present

## 2021-06-13 LAB — POCT GLYCOSYLATED HEMOGLOBIN (HGB A1C): HbA1c, POC (controlled diabetic range): 6.1 % (ref 0.0–7.0)

## 2021-06-13 MED ORDER — LEVEMIR FLEXTOUCH 100 UNIT/ML ~~LOC~~ SOPN: 45.0000 [IU] | PEN_INJECTOR | Freq: Every day | SUBCUTANEOUS | 3 refills | Status: DC

## 2021-06-13 NOTE — Patient Instructions (Signed)
Advice for Weight Management  -For most of us the best way to lose weight is by diet management. Generally speaking, diet management means consuming less calories intentionally which over time brings about progressive weight loss.  This can be achieved more effectively by restricting carbohydrate consumption to the minimum possible.  So, it is critically important to know your numbers: how much calorie you are consuming and how much calorie you need. More importantly, our carbohydrates sources should be unprocessed or minimally processed complex starch food items.   Sometimes, it is important to balance nutrition by increasing protein intake (animal or plant source), fruits, and vegetables.  -Sticking to a routine mealtime to eat 3 meals a day and avoiding unnecessary snacks is shown to have a big role in weight control. Under normal circumstances, the only time we lose real weight is when we are hungry, so allow hunger to take place- hunger means no food between meal times, only water.  It is not advisable to starve.   -It is better to avoid simple carbohydrates including: Cakes, Sweet Desserts, Ice Cream, Soda (diet and regular), Sweet Tea, Candies, Chips, Cookies, Store Bought Juices, Alcohol in Excess of  1-2 drinks a day, Artificial Sweeteners, Doughnuts, Coffee Creamers, "Sugar-free" Products, etc, etc.  This is not a complete list.....    -Consulting with certified diabetes educators is proven to provide you with the most accurate and current information on diet.  Also, you may be  interested in discussing diet options/exchanges , we can schedule a visit with Seth Werner, RDN, CDE for individualized nutrition education.  -Exercise: If you are able: 30 -60 minutes a day ,4 days a week, or 150 minutes a week.  The longer the better.  Combine stretch, strength, and aerobic activities.  If you were told in the past that you have high risk for cardiovascular diseases, you may seek evaluation by  your heart doctor prior to initiating moderate to intense exercise programs.    
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
## 2021-06-21 ENCOUNTER — Encounter: Payer: Self-pay | Admitting: *Deleted

## 2021-06-22 ENCOUNTER — Encounter: Payer: Self-pay | Admitting: Cardiology

## 2021-06-22 ENCOUNTER — Other Ambulatory Visit: Payer: Self-pay

## 2021-06-22 ENCOUNTER — Ambulatory Visit (INDEPENDENT_AMBULATORY_CARE_PROVIDER_SITE_OTHER): Payer: PPO | Admitting: Cardiology

## 2021-06-22 VITALS — BP 136/64 | HR 68 | Ht 65.0 in | Wt 259.8 lb

## 2021-06-22 DIAGNOSIS — I10 Essential (primary) hypertension: Secondary | ICD-10-CM | POA: Diagnosis not present

## 2021-06-22 DIAGNOSIS — M1612 Unilateral primary osteoarthritis, left hip: Secondary | ICD-10-CM | POA: Diagnosis not present

## 2021-06-22 DIAGNOSIS — Z8739 Personal history of other diseases of the musculoskeletal system and connective tissue: Secondary | ICD-10-CM | POA: Diagnosis not present

## 2021-06-22 DIAGNOSIS — R0789 Other chest pain: Secondary | ICD-10-CM

## 2021-06-22 DIAGNOSIS — M25552 Pain in left hip: Secondary | ICD-10-CM | POA: Diagnosis not present

## 2021-06-22 DIAGNOSIS — G5603 Carpal tunnel syndrome, bilateral upper limbs: Secondary | ICD-10-CM | POA: Diagnosis not present

## 2021-06-22 DIAGNOSIS — E782 Mixed hyperlipidemia: Secondary | ICD-10-CM | POA: Diagnosis not present
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
## 2021-06-22 NOTE — Patient Instructions (Signed)

## 2021-06-26 DIAGNOSIS — G8929 Other chronic pain: Secondary | ICD-10-CM | POA: Diagnosis not present

## 2021-06-26 DIAGNOSIS — M25552 Pain in left hip: Secondary | ICD-10-CM | POA: Diagnosis not present

## 2021-06-27 DIAGNOSIS — H31093 Other chorioretinal scars, bilateral: Secondary | ICD-10-CM | POA: Diagnosis not present

## 2021-06-27 DIAGNOSIS — E113513 Type 2 diabetes mellitus with proliferative diabetic retinopathy with macular edema, bilateral: Secondary | ICD-10-CM | POA: Diagnosis not present

## 2021-06-27 DIAGNOSIS — E113512 Type 2 diabetes mellitus with proliferative diabetic retinopathy with macular edema, left eye: Secondary | ICD-10-CM | POA: Diagnosis not present

## 2021-07-12 ENCOUNTER — Other Ambulatory Visit: Payer: Self-pay

## 2021-07-12 ENCOUNTER — Encounter: Payer: Self-pay | Admitting: Urology

## 2021-07-12 ENCOUNTER — Ambulatory Visit: Payer: PPO | Admitting: Urology

## 2021-07-12 VITALS — BP 159/76 | HR 76

## 2021-07-12 DIAGNOSIS — N35813 Other membranous urethral stricture, male: Secondary | ICD-10-CM

## 2021-07-12 DIAGNOSIS — Z8546 Personal history of malignant neoplasm of prostate: Secondary | ICD-10-CM | POA: Diagnosis not present

## 2021-07-12 LAB — MICROSCOPIC EXAMINATION
Bacteria, UA: NONE SEEN
RBC, Urine: NONE SEEN /hpf (ref 0–2)
Renal Epithel, UA: NONE SEEN /hpf

## 2021-07-12 LAB — URINALYSIS, ROUTINE W REFLEX MICROSCOPIC
Bilirubin, UA: NEGATIVE
Glucose, UA: NEGATIVE
Ketones, UA: NEGATIVE
Nitrite, UA: NEGATIVE
Protein,UA: NEGATIVE
RBC, UA: NEGATIVE
Specific Gravity, UA: 1.015 (ref 1.005–1.030)
Urobilinogen, Ur: 0.2 mg/dL (ref 0.2–1.0)
pH, UA: 6 (ref 5.0–7.5)

## 2021-07-12 NOTE — Progress Notes (Signed)
Urological Symptom Review  Patient is experiencing the following symptoms: Get up at night to urinate Blood in urine Weak stream   Review of Systems  Gastrointestinal (upper)  : Indigestion/heartburn  Gastrointestinal (lower) : Negative for lower GI symptoms  Constitutional : Night Sweats  Skin: Negative for skin symptoms  Eyes: Negative for eye symptoms  Ear/Nose/Throat : Negative for Ear/Nose/Throat symptoms  Hematologic/Lymphatic: Negative for Hematologic/Lymphatic symptoms  Cardiovascular : Leg swelling  Respiratory : Negative for respiratory symptoms  Endocrine: Negative for endocrine symptoms  Musculoskeletal: Joint pain  Neurological: Negative for neurological symptoms  Psychologic: Negative for psychiatric symptoms
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
## 2021-07-13 LAB — PSA: Prostate Specific Ag, Serum: 0.3 ng/mL (ref 0.0–4.0)

## 2021-07-17 NOTE — Progress Notes (Signed)
Results printed and mailed.   

## 2021-07-24 ENCOUNTER — Telehealth: Payer: Self-pay

## 2021-07-24 NOTE — Telephone Encounter (Signed)
Pt left a voicemail:  Calling about needing catheters sent to home. Saying the old catheters worked.  Please advise.  Call back:  [phone] (H)   Thanks, Helene Kelp

## 2021-07-24 NOTE — Telephone Encounter (Signed)
Patient states the 65 f coude catheters have worked for him and request order to be sent into [facility]. Order sent to [facility].

## 2021-08-09 ENCOUNTER — Other Ambulatory Visit: Payer: Self-pay | Admitting: Nurse Practitioner

## 2021-09-04 ENCOUNTER — Other Ambulatory Visit: Payer: Self-pay | Admitting: "Endocrinology

## 2021-09-04 ENCOUNTER — Other Ambulatory Visit: Payer: Self-pay

## 2021-09-04 MED ORDER — ONETOUCH VERIO VI STRP: ORAL_STRIP | 3 refills | Status: DC

## 2021-10-16 ENCOUNTER — Telehealth: Payer: Self-pay

## 2021-10-16 ENCOUNTER — Other Ambulatory Visit: Payer: Self-pay

## 2021-10-16 MED ORDER — LEVEMIR FLEXTOUCH 100 UNIT/ML ~~LOC~~ SOPN: 45.0000 [IU] | PEN_INJECTOR | Freq: Every day | SUBCUTANEOUS | 3 refills | Status: DC

## 2021-10-16 MED ORDER — ONETOUCH VERIO VI STRP: ORAL_STRIP | 3 refills | Status: DC

## 2021-10-16 MED ORDER — TRULICITY 1.5 MG/0.5ML ~~LOC~~ SOAJ: SUBCUTANEOUS | 2 refills | Status: DC

## 2021-10-16 NOTE — Telephone Encounter (Signed)
Pt called back, he needs all of his medication and supplies from [place] to go to [facility] in [place] including his Levemir and Trulicity due to insurance.  ?

## 2021-10-16 NOTE — Telephone Encounter (Signed)
Called patient and left a detailed voice message to find out if he wanted to use [facility] in [place] for his test strips and lancets instead of [facility] in [place]. ?

## 2021-10-16 NOTE — Telephone Encounter (Signed)
Sent requested medications to the pharmacy requested by patient. ?

## 2021-11-07 ENCOUNTER — Telehealth: Payer: Self-pay

## 2021-11-07 NOTE — Telephone Encounter (Addendum)
[facility] called today and needs last OV amended for medicare payment ? ?Please add to documentation- ?Patient can't pass straight tip catheter due to ( and state reason)  patient requiring coude catheter. ? ? will fax addend note to [phone] ? ?Assessment & Plan: ?History of prostate cancer.   He has no concerning symptoms.  I will get a PSA today and in a year.  ?  ?Post procedural membranous stricture.   He has some increased difficulty with cathing so I will have him try a 14 fr coude.   If that doesn't help, he will let me know and I will set him up to go to the OR for a possible dilation.   ?

## 2021-11-08 ENCOUNTER — Encounter: Payer: Self-pay | Admitting: Urology

## 2021-11-08 NOTE — Progress Notes (Signed)
Patient can't pass straight tip catheter due to a membranous stricture with altered anatomy. Patient requiring coude catheter. ?

## 2021-11-12 NOTE — Telephone Encounter (Signed)
Note faxed.

## 2021-12-11 ENCOUNTER — Encounter: Payer: Self-pay | Admitting: Nurse Practitioner

## 2021-12-11 ENCOUNTER — Ambulatory Visit: Payer: PPO | Admitting: Nurse Practitioner

## 2021-12-11 VITALS — BP 162/86 | HR 68 | Ht 65.0 in | Wt 263.8 lb

## 2021-12-11 DIAGNOSIS — N183 Chronic kidney disease, stage 3 unspecified: Secondary | ICD-10-CM

## 2021-12-11 DIAGNOSIS — E782 Mixed hyperlipidemia: Secondary | ICD-10-CM

## 2021-12-11 DIAGNOSIS — I10 Essential (primary) hypertension: Secondary | ICD-10-CM

## 2021-12-11 DIAGNOSIS — E1122 Type 2 diabetes mellitus with diabetic chronic kidney disease: Secondary | ICD-10-CM

## 2021-12-11 DIAGNOSIS — E559 Vitamin D deficiency, unspecified: Secondary | ICD-10-CM

## 2021-12-11 LAB — POCT GLYCOSYLATED HEMOGLOBIN (HGB A1C): HbA1c, POC (controlled diabetic range): 7.3 % — AB (ref 0.0–7.0)

## 2021-12-11 LAB — POCT UA - MICROALBUMIN
Creatinine, POC: 200 mg/dL
Microalbumin Ur, POC: 150 mg/L

## 2021-12-11 NOTE — Patient Instructions (Signed)
Diabetes Mellitus and Foot Care Foot care is an important part of your health, especially when you have diabetes. Diabetes may cause you to have problems because of poor blood flow (circulation) to your feet and legs, which can cause your skin to: Become thinner and drier. Break more easily. Heal more slowly. Peel and crack. You may also have nerve damage (neuropathy) in your legs and feet, causing decreased feeling in them. This means that you may not notice minor injuries to your feet that could lead to more serious problems. Noticing and addressing any potential problems early is the best way to prevent future foot problems. How to care for your feet Foot hygiene  Wash your feet daily with warm water and mild soap. Do not use hot water. Then, pat your feet and the areas between your toes until they are completely dry. Do not soak your feet as this can dry your skin. Trim your toenails straight across. Do not dig under them or around the cuticle. File the edges of your nails with an emery board or nail file. Apply a moisturizing lotion or petroleum jelly to the skin on your feet and to dry, brittle toenails. Use lotion that does not contain alcohol and is unscented. Do not apply lotion between your toes. Shoes and socks Wear clean socks or stockings every day. Make sure they are not too tight. Do not wear knee-high stockings since they may decrease blood flow to your legs. Wear shoes that fit properly and have enough cushioning. Always look in your shoes before you put them on to be sure there are no objects inside. To break in new shoes, wear them for just a few hours a day. This prevents injuries on your feet. Wounds, scrapes, corns, and calluses  Check your feet daily for blisters, cuts, bruises, sores, and redness. If you cannot see the bottom of your feet, use a mirror or ask someone for help. Do not cut corns or calluses or try to remove them with medicine. If you find a minor scrape,  cut, or break in the skin on your feet, keep it and the skin around it clean and dry. You may clean these areas with mild soap and water. Do not clean the area with peroxide, alcohol, or iodine. If you have a wound, scrape, corn, or callus on your foot, look at it several times a day to make sure it is healing and not infected. Check for: Redness, swelling, or pain. Fluid or blood. Warmth. Pus or a bad smell. General tips Do not cross your legs. This may decrease blood flow to your feet. Do not use heating pads or hot water bottles on your feet. They may burn your skin. If you have lost feeling in your feet or legs, you may not know this is happening until it is too late. Protect your feet from hot and cold by wearing shoes, such as at the beach or on hot pavement. Schedule a complete foot exam at least once a year (annually) or more often if you have foot problems. Report any cuts, sores, or bruises to your health care provider immediately. Where to find more information American Diabetes Association: www.diabetes.org Association of Diabetes Care & Education Specialists: www.diabeteseducator.org Contact a health care provider if: You have a medical condition that increases your risk of infection and you have any cuts, sores, or bruises on your feet. You have an injury that is not healing. You have redness on your legs or feet. You   feel burning or tingling in your legs or feet. You have pain or cramps in your legs and feet. Your legs or feet are numb. Your feet always feel cold. You have pain around any toenails. Get help right away if: You have a wound, scrape, corn, or callus on your foot and: You have pain, swelling, or redness that gets worse. You have fluid or blood coming from the wound, scrape, corn, or callus. Your wound, scrape, corn, or callus feels warm to the touch. You have pus or a bad smell coming from the wound, scrape, corn, or callus. You have a fever. You have a red  line going up your leg. Summary Check your feet every day for blisters, cuts, bruises, sores, and redness. Apply a moisturizing lotion or petroleum jelly to the skin on your feet and to dry, brittle toenails. Wear shoes that fit properly and have enough cushioning. If you have foot problems, report any cuts, sores, or bruises to your health care provider immediately. Schedule a complete foot exam at least once a year (annually) or more often if you have foot problems. This information is not intended to replace advice given to you by your health care provider. Make sure you discuss any questions you have with your health care provider. Document Revised: 01/27/2020 Document Reviewed: 01/27/2020 Elsevier Patient Education  2023 [employer]  
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
## 2022-03-08 ENCOUNTER — Ambulatory Visit: Payer: PPO | Admitting: Cardiology

## 2022-03-08 ENCOUNTER — Encounter: Payer: Self-pay | Admitting: Cardiology

## 2022-03-08 VITALS — BP 110/60 | HR 74 | Ht 65.0 in | Wt 263.4 lb

## 2022-03-08 DIAGNOSIS — I10 Essential (primary) hypertension: Secondary | ICD-10-CM

## 2022-03-08 DIAGNOSIS — E782 Mixed hyperlipidemia: Secondary | ICD-10-CM

## 2022-03-08 DIAGNOSIS — R0789 Other chest pain: Secondary | ICD-10-CM

## 2022-03-08 NOTE — Patient Instructions (Signed)
Medication Instructions:  Your physician recommends that you continue on your current medications as directed. Please refer to the Current Medication list given to you today.   Labwork: None  Testing/Procedures: None  Follow-Up: Follow up with Dr. Branch as needed.   Any Other Special Instructions Will Be Listed Below (If Applicable).     If you need a refill on your cardiac medications before your next appointment, please call your pharmacy.  
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
## 2022-03-19 ENCOUNTER — Other Ambulatory Visit: Payer: Self-pay | Admitting: Nurse Practitioner

## 2022-04-16 ENCOUNTER — Other Ambulatory Visit: Payer: Self-pay | Admitting: "Endocrinology

## 2022-06-11 ENCOUNTER — Other Ambulatory Visit: Payer: Self-pay | Admitting: Nurse Practitioner

## 2022-06-13 LAB — LIPID PANEL
Chol/HDL Ratio: 2.6 ratio (ref 0.0–5.0)
Cholesterol, Total: 131 mg/dL (ref 100–199)
HDL: 51 mg/dL (ref >39)
LDL Chol Calc (NIH): 65 mg/dL (ref 0–99)
Triglycerides: 72 mg/dL (ref 0–149)
VLDL Cholesterol Cal: 15 mg/dL (ref 5–40)

## 2022-06-13 LAB — COMPREHENSIVE METABOLIC PANEL
ALT: 14 IU/L (ref 0–44)
AST: 17 IU/L (ref 0–40)
Albumin/Globulin Ratio: 1.5 (ref 1.2–2.2)
Albumin: 3.8 g/dL — ABNORMAL LOW (ref 3.9–4.9)
Alkaline Phosphatase: 101 IU/L (ref 44–121)
BUN/Creatinine Ratio: 9 — ABNORMAL LOW (ref 10–24)
BUN: 16 mg/dL (ref 8–27)
Bilirubin Total: 0.3 mg/dL (ref 0.0–1.2)
CO2: 22 mmol/L (ref 20–29)
Calcium: 8.7 mg/dL (ref 8.6–10.2)
Chloride: 105 mmol/L (ref 96–106)
Creatinine, Ser: 1.81 mg/dL — ABNORMAL HIGH (ref 0.76–1.27)
Globulin, Total: 2.5 g/dL (ref 1.5–4.5)
Glucose: 128 mg/dL — ABNORMAL HIGH (ref 70–99)
Potassium: 4.7 mmol/L (ref 3.5–5.2)
Sodium: 142 mmol/L (ref 134–144)
Total Protein: 6.3 g/dL (ref 6.0–8.5)
eGFR: 40 mL/min/{1.73_m2} — ABNORMAL LOW (ref >59)

## 2022-06-13 LAB — TSH: TSH: 1.91 u[IU]/mL (ref 0.450–4.500)

## 2022-06-13 LAB — T4, FREE: Free T4: 1.53 ng/dL (ref 0.82–1.77)

## 2022-06-13 LAB — VITAMIN D 25 HYDROXY (VIT D DEFICIENCY, FRACTURES): Vit D, 25-Hydroxy: 33.7 ng/mL (ref 30.0–100.0)

## 2022-06-18 ENCOUNTER — Ambulatory Visit: Payer: PPO | Admitting: Nurse Practitioner

## 2022-06-18 ENCOUNTER — Encounter: Payer: Self-pay | Admitting: Nurse Practitioner

## 2022-06-18 VITALS — BP 126/68 | HR 73 | Ht 65.0 in | Wt 271.4 lb

## 2022-06-18 DIAGNOSIS — I10 Essential (primary) hypertension: Secondary | ICD-10-CM

## 2022-06-18 DIAGNOSIS — N183 Chronic kidney disease, stage 3 unspecified: Secondary | ICD-10-CM

## 2022-06-18 DIAGNOSIS — E782 Mixed hyperlipidemia: Secondary | ICD-10-CM

## 2022-06-18 DIAGNOSIS — E1122 Type 2 diabetes mellitus with diabetic chronic kidney disease: Secondary | ICD-10-CM | POA: Diagnosis not present

## 2022-06-18 DIAGNOSIS — E559 Vitamin D deficiency, unspecified: Secondary | ICD-10-CM | POA: Diagnosis not present

## 2022-06-18 LAB — POCT GLYCOSYLATED HEMOGLOBIN (HGB A1C): Hemoglobin A1C: 6.7 % — AB (ref 4.0–5.6)

## 2022-06-18 MED ORDER — ONETOUCH ULTRASOFT LANCETS MISC: 3 refills | Status: DC

## 2022-06-18 MED ORDER — BD PEN NEEDLE NANO 2ND GEN 32G X 4 MM MISC: 3 refills | Status: DC

## 2022-06-18 MED ORDER — LEVEMIR FLEXTOUCH 100 UNIT/ML ~~LOC~~ SOPN: 42.0000 [IU] | PEN_INJECTOR | Freq: Every day | SUBCUTANEOUS | 3 refills | Status: DC

## 2022-06-18 MED ORDER — ONETOUCH VERIO VI STRP: ORAL_STRIP | 3 refills | Status: DC

## 2022-06-18 MED ORDER — TRULICITY 1.5 MG/0.5ML ~~LOC~~ SOAJ: 1.5000 mg | SUBCUTANEOUS | 3 refills | Status: DC
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
## 2022-08-09 ENCOUNTER — Telehealth: Payer: Self-pay | Admitting: *Deleted

## 2022-08-09 NOTE — Telephone Encounter (Signed)
Per Loree Fee , may be able to check with other pharmacies. It would not benefit the patient to lower the dose. Patient was called and made aware that some other pharmacies were called and they all are saying the same thing. This medication is currently not available.  Patient's dose was due today , 08/09/2022 , is there any other options other than waiting until 08/14/22 when the warehouse has told [facility] it may come in?

## 2022-08-09 NOTE — Telephone Encounter (Signed)
Patient left a message stating that he cannot get his 1.5 Trulicity per his pharmacy. That this had changed and his shot is due today.  I called [facility]. They said that it was on back order from their warehouse.. She said that she had the .75 dose on hand. All other doses are on back order, and they have been told that it may be 08/14/22 before they might get it.  What may we do?

## 2022-08-12 NOTE — Telephone Encounter (Signed)
There really are no ideal options other than waiting for the shipment or checking with other pharmacies.  We do not have any samples of Trulicity.

## 2022-08-15 ENCOUNTER — Ambulatory Visit: Payer: PPO | Admitting: Cardiology

## 2022-08-19 NOTE — Telephone Encounter (Signed)
Patient was called and a voicemail was left with this information.

## 2022-09-30 ENCOUNTER — Other Ambulatory Visit (HOSPITAL_COMMUNITY): Payer: Self-pay

## 2022-11-01 ENCOUNTER — Telehealth: Payer: Self-pay

## 2022-11-01 NOTE — Telephone Encounter (Addendum)
Patient called requested catheter supplies.  Patient needs an appointment and with progress notes to support this need. Patient aware he will need a f/u since he was last seen 06/2021, apt scheduled and apt reminder mailed.  Will fax office notes from visit to [phone]

## 2022-11-13 ENCOUNTER — Other Ambulatory Visit (HOSPITAL_COMMUNITY): Payer: Self-pay

## 2022-11-28 ENCOUNTER — Ambulatory Visit: Payer: PPO | Admitting: Internal Medicine

## 2022-12-03 ENCOUNTER — Ambulatory Visit: Payer: PPO | Attending: Internal Medicine | Admitting: Nurse Practitioner

## 2022-12-03 ENCOUNTER — Encounter: Payer: Self-pay | Admitting: Nurse Practitioner

## 2022-12-03 VITALS — BP 130/70 | HR 80 | Ht 65.0 in | Wt 265.0 lb

## 2022-12-03 DIAGNOSIS — E785 Hyperlipidemia, unspecified: Secondary | ICD-10-CM | POA: Diagnosis not present

## 2022-12-03 DIAGNOSIS — R601 Generalized edema: Secondary | ICD-10-CM | POA: Diagnosis not present

## 2022-12-03 DIAGNOSIS — I10 Essential (primary) hypertension: Secondary | ICD-10-CM | POA: Diagnosis not present

## 2022-12-03 DIAGNOSIS — N179 Acute kidney failure, unspecified: Secondary | ICD-10-CM | POA: Diagnosis not present

## 2022-12-03 DIAGNOSIS — N183 Chronic kidney disease, stage 3 unspecified: Secondary | ICD-10-CM
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
## 2022-12-03 NOTE — Patient Instructions (Addendum)
Medication Instructions:  Your physician recommends that you continue on your current medications as directed. Please refer to the Current Medication list given to you today.  Labwork: none  Testing/Procedures: none  Follow-Up: Your physician recommends that you schedule a follow-up appointment in: 2 months  Any Other Special Instructions Will Be Listed Below (If Applicable).  If you need a refill on your cardiac medications before your next appointment, please call your pharmacy. 

## 2022-12-12 ENCOUNTER — Other Ambulatory Visit: Payer: Self-pay | Admitting: Nurse Practitioner

## 2022-12-12 ENCOUNTER — Encounter: Payer: Self-pay | Admitting: Nurse Practitioner

## 2022-12-12 ENCOUNTER — Ambulatory Visit: Payer: PPO | Admitting: Nurse Practitioner

## 2022-12-12 VITALS — BP 120/64 | HR 88 | Ht 65.0 in | Wt 263.0 lb

## 2022-12-12 DIAGNOSIS — N183 Chronic kidney disease, stage 3 unspecified: Secondary | ICD-10-CM

## 2022-12-12 DIAGNOSIS — I10 Essential (primary) hypertension: Secondary | ICD-10-CM

## 2022-12-12 DIAGNOSIS — E1122 Type 2 diabetes mellitus with diabetic chronic kidney disease: Secondary | ICD-10-CM | POA: Diagnosis not present

## 2022-12-12 DIAGNOSIS — E559 Vitamin D deficiency, unspecified: Secondary | ICD-10-CM

## 2022-12-12 DIAGNOSIS — Z794 Long term (current) use of insulin: Secondary | ICD-10-CM | POA: Diagnosis not present

## 2022-12-12 DIAGNOSIS — Z7984 Long term (current) use of oral hypoglycemic drugs: Secondary | ICD-10-CM

## 2022-12-12 DIAGNOSIS — Z7985 Long-term (current) use of injectable non-insulin antidiabetic drugs: Secondary | ICD-10-CM

## 2022-12-12 LAB — POCT GLYCOSYLATED HEMOGLOBIN (HGB A1C)

## 2022-12-12 MED ORDER — TRULICITY 1.5 MG/0.5ML ~~LOC~~ SOAJ: 1.5000 mg | SUBCUTANEOUS | 3 refills | Status: DC

## 2022-12-12 MED ORDER — BD PEN NEEDLE NANO 2ND GEN 32G X 4 MM MISC: 3 refills | Status: DC

## 2022-12-12 MED ORDER — LEVEMIR FLEXTOUCH 100 UNIT/ML ~~LOC~~ SOPN: 30.0000 [IU] | PEN_INJECTOR | Freq: Every day | SUBCUTANEOUS | 3 refills | Status: DC
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
## 2022-12-14 ENCOUNTER — Other Ambulatory Visit: Payer: Self-pay | Admitting: Nurse Practitioner

## 2022-12-18 ENCOUNTER — Ambulatory Visit: Payer: PPO | Admitting: Nurse Practitioner

## 2022-12-19 ENCOUNTER — Telehealth: Payer: Self-pay | Admitting: Nurse Practitioner

## 2022-12-19 ENCOUNTER — Encounter: Payer: Self-pay | Admitting: Urology

## 2022-12-19 ENCOUNTER — Ambulatory Visit (INDEPENDENT_AMBULATORY_CARE_PROVIDER_SITE_OTHER): Payer: PPO | Admitting: Urology

## 2022-12-19 VITALS — BP 147/80 | HR 75 | Ht 65.0 in | Wt 263.0 lb

## 2022-12-19 DIAGNOSIS — N390 Urinary tract infection, site not specified: Secondary | ICD-10-CM

## 2022-12-19 DIAGNOSIS — R300 Dysuria: Secondary | ICD-10-CM | POA: Diagnosis not present

## 2022-12-19 DIAGNOSIS — Z8546 Personal history of malignant neoplasm of prostate: Secondary | ICD-10-CM | POA: Diagnosis not present

## 2022-12-19 DIAGNOSIS — N35813 Other membranous urethral stricture, male: Secondary | ICD-10-CM

## 2022-12-19 LAB — URINALYSIS, ROUTINE W REFLEX MICROSCOPIC
Bilirubin, UA: NEGATIVE
Ketones, UA: NEGATIVE
Nitrite, UA: POSITIVE — AB
Specific Gravity, UA: 1.025 (ref 1.005–1.030)
Urobilinogen, Ur: 0.2 mg/dL (ref 0.2–1.0)
pH, UA: 5.5 (ref 5.0–7.5)

## 2022-12-19 LAB — MICROSCOPIC EXAMINATION: WBC, UA: >30 /hpf — AB (ref 0–5)

## 2022-12-19 NOTE — Telephone Encounter (Signed)
Patient was called and made aware. 

## 2022-12-19 NOTE — Telephone Encounter (Signed)
Patient called and asked if he is suppose to be taking Basgalar in an addition to his Levemir. Please advise [phone] (pt gave permission to leave a detailed VM)

## 2022-12-19 NOTE — Telephone Encounter (Signed)
Levemir is no longer being produced- the Mariella Saa is to be used once the Levemir supply is finished.  Will do the same dosing as he was with the Levemir.
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
## 2022-12-20 ENCOUNTER — Telehealth: Payer: Self-pay

## 2022-12-20 LAB — PSA: Prostate Specific Ag, Serum: 0.4 ng/mL (ref 0.0–4.0)

## 2022-12-20 NOTE — Telephone Encounter (Signed)
14 F coude catheter supply order faxed to [facility] today.

## 2022-12-24 ENCOUNTER — Telehealth: Payer: Self-pay

## 2022-12-24 NOTE — Telephone Encounter (Signed)
[facility] called requesting last ov notes be faxed to continue catheter supplies for patient.  [facility] contact information updated in Epic and re-faxed.

## 2022-12-25 LAB — URINE CULTURE

## 2022-12-26 ENCOUNTER — Telehealth: Payer: Self-pay

## 2022-12-26 MED ORDER — CIPROFLOXACIN HCL 250 MG PO TABS: 250.0000 mg | ORAL_TABLET | Freq: Two times a day (BID) | ORAL | 0 refills | Status: DC

## 2022-12-26 NOTE — Telephone Encounter (Signed)
Patient aware of MD response to urine culture via voicemail and rx sent to pharmacy.

## 2022-12-26 NOTE — Telephone Encounter (Signed)
-----   Message from Bjorn Pippin, MD sent at 12/25/2022  5:27 PM EDT ----- Culture is positive.  He needs cipro 250mg  po bid #10 ----- Message ----- From: Grier Rocher, CMA Sent: 12/25/2022   3:40 PM EDT To: Bjorn Pippin, MD  Please review, no treatment started.

## 2023-01-09 DIAGNOSIS — D472 Monoclonal gammopathy: Secondary | ICD-10-CM | POA: Insufficient documentation
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
## 2023-01-10 ENCOUNTER — Encounter: Payer: Self-pay | Admitting: Hematology

## 2023-01-10 ENCOUNTER — Inpatient Hospital Stay: Payer: PPO

## 2023-01-10 ENCOUNTER — Inpatient Hospital Stay: Payer: PPO | Attending: Hematology | Admitting: Hematology

## 2023-01-10 VITALS — BP 138/69 | HR 76 | Temp 98.4°F | Resp 20 | Ht 65.0 in | Wt 263.0 lb

## 2023-01-10 DIAGNOSIS — Z8546 Personal history of malignant neoplasm of prostate: Secondary | ICD-10-CM | POA: Insufficient documentation

## 2023-01-10 DIAGNOSIS — D472 Monoclonal gammopathy: Secondary | ICD-10-CM | POA: Diagnosis present

## 2023-01-10 DIAGNOSIS — D649 Anemia, unspecified: Secondary | ICD-10-CM | POA: Insufficient documentation

## 2023-01-10 DIAGNOSIS — Z923 Personal history of irradiation: Secondary | ICD-10-CM | POA: Diagnosis not present

## 2023-01-10 LAB — CBC WITH DIFFERENTIAL/PLATELET
Abs Immature Granulocytes: 0.01 10*3/uL (ref 0.00–0.07)
Basophils Absolute: 0 10*3/uL (ref 0.0–0.1)
Basophils Relative: 0 %
Eosinophils Absolute: 0.2 10*3/uL (ref 0.0–0.5)
Eosinophils Relative: 4 %
HCT: 35.8 % — ABNORMAL LOW (ref 39.0–52.0)
Hemoglobin: 11.8 g/dL — ABNORMAL LOW (ref 13.0–17.0)
Immature Granulocytes: 0 %
Lymphocytes Relative: 40 %
Lymphs Abs: 1.8 10*3/uL (ref 0.7–4.0)
MCH: 29.7 pg (ref 26.0–34.0)
MCHC: 33 g/dL (ref 30.0–36.0)
MCV: 90.2 fL (ref 80.0–100.0)
Monocytes Absolute: 0.4 10*3/uL (ref 0.1–1.0)
Monocytes Relative: 8 %
Neutro Abs: 2.1 10*3/uL (ref 1.7–7.7)
Neutrophils Relative %: 48 %
Platelets: 183 10*3/uL (ref 150–400)
RBC: 3.97 MIL/uL — ABNORMAL LOW (ref 4.22–5.81)
RDW: 14.1 % (ref 11.5–15.5)
WBC: 4.5 10*3/uL (ref 4.0–10.5)
nRBC: 0 % (ref 0.0–0.2)

## 2023-01-10 LAB — LACTATE DEHYDROGENASE: LDH: 138 U/L (ref 98–192)

## 2023-01-10 LAB — RETICULOCYTES
Immature Retic Fract: 16 % — ABNORMAL HIGH (ref 2.3–15.9)
RBC.: 3.99 MIL/uL — ABNORMAL LOW (ref 4.22–5.81)
Retic Count, Absolute: 57.9 10*3/uL (ref 19.0–186.0)
Retic Ct Pct: 1.5 % (ref 0.4–3.1)

## 2023-01-10 NOTE — Patient Instructions (Signed)
You were seen and examined today by Dr. Ellin Saba. Dr. Ellin Saba is a hematologist, meaning that he specializes in blood abnormalities. Dr. Ellin Saba discussed your past medical history, family history of cancers/blood conditions and the events that led to you being here today.  You were referred to Dr. Ellin Saba due to an abnormal protein in the blood. This is a condition called MGUS (monoclonal gammopathy of unknown significance).   Dr. Ellin Saba has recommended additional labs today for further evaluation.  Follow-up as scheduled.

## 2023-01-11 LAB — BETA 2 MICROGLOBULIN, SERUM: Beta-2 Microglobulin: 3.9 mg/L — ABNORMAL HIGH (ref 0.6–2.4)

## 2023-01-13 LAB — KAPPA/LAMBDA LIGHT CHAINS
Kappa free light chain: 65.6 mg/L — ABNORMAL HIGH (ref 3.3–19.4)
Kappa, lambda light chain ratio: 1.84 — ABNORMAL HIGH (ref 0.26–1.65)
Lambda free light chains: 35.6 mg/L — ABNORMAL HIGH (ref 5.7–26.3)

## 2023-01-13 LAB — COPPER, SERUM: Copper: 104 ug/dL (ref 69–132)

## 2023-01-14 ENCOUNTER — Other Ambulatory Visit: Payer: Self-pay

## 2023-01-14 LAB — PROTEIN ELECTROPHORESIS, SERUM
A/G Ratio: 1.2 (ref 0.7–1.7)
Albumin ELP: 3.3 g/dL (ref 2.9–4.4)
Alpha-1-Globulin: 0.2 g/dL (ref 0.0–0.4)
Alpha-2-Globulin: 0.7 g/dL (ref 0.4–1.0)
Beta Globulin: 1 g/dL (ref 0.7–1.3)
Gamma Globulin: 0.8 g/dL (ref 0.4–1.8)
Globulin, Total: 2.7 g/dL (ref 2.2–3.9)
Total Protein ELP: 6 g/dL (ref 6.0–8.5)

## 2023-01-14 LAB — METHYLMALONIC ACID, SERUM: Methylmalonic Acid, Quantitative: 290 nmol/L (ref 0–378)

## 2023-01-14 MED ORDER — BASAGLAR KWIKPEN 100 UNIT/ML ~~LOC~~ SOPN: 30.0000 [IU] | PEN_INJECTOR | Freq: Every day | SUBCUTANEOUS | 0 refills | Status: DC

## 2023-01-17 LAB — IMMUNOFIXATION ELECTROPHORESIS
IgA: 186 mg/dL (ref 61–437)
IgG (Immunoglobin G), Serum: 1060 mg/dL (ref 603–1613)
IgM (Immunoglobulin M), Srm: 7 mg/dL — ABNORMAL LOW (ref 20–172)
Total Protein ELP: 6 g/dL (ref 6.0–8.5)

## 2023-01-28 ENCOUNTER — Encounter: Payer: Self-pay | Admitting: Pulmonary Disease

## 2023-01-28 ENCOUNTER — Ambulatory Visit: Payer: PPO | Admitting: Pulmonary Disease

## 2023-01-28 VITALS — BP 125/63 | HR 80 | Ht 65.0 in | Wt 268.2 lb

## 2023-01-28 DIAGNOSIS — G4733 Obstructive sleep apnea (adult) (pediatric): Secondary | ICD-10-CM

## 2023-01-28 NOTE — Patient Instructions (Signed)
Will arrange for a home sleep study Will call to arrange for follow up after sleep study reviewed  
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
## 2023-02-03 ENCOUNTER — Encounter: Payer: Self-pay | Admitting: Nurse Practitioner

## 2023-02-03 ENCOUNTER — Ambulatory Visit: Payer: PPO | Attending: Nurse Practitioner | Admitting: Nurse Practitioner

## 2023-02-03 VITALS — BP 128/82 | HR 73 | Wt 267.6 lb

## 2023-02-03 DIAGNOSIS — N184 Chronic kidney disease, stage 4 (severe): Secondary | ICD-10-CM | POA: Diagnosis not present

## 2023-02-03 DIAGNOSIS — I10 Essential (primary) hypertension: Secondary | ICD-10-CM | POA: Diagnosis not present

## 2023-02-03 DIAGNOSIS — E785 Hyperlipidemia, unspecified: Secondary | ICD-10-CM

## 2023-02-03 DIAGNOSIS — I5032 Chronic diastolic (congestive) heart failure: Secondary | ICD-10-CM | POA: Diagnosis not present

## 2023-02-03 MED ORDER — NITROGLYCERIN 0.4 MG SL SUBL: 0.4000 mg | SUBLINGUAL_TABLET | SUBLINGUAL | 3 refills | Status: AC | PRN

## 2023-02-03 NOTE — Patient Instructions (Addendum)
 Medication Instructions:  Your physician recommends that you continue on your current medications as directed. Please refer to the Current Medication list given to you today.  Labwork: none  Testing/Procedures: none  Follow-Up: Your physician recommends that you schedule a follow-up appointment in: 3 Months with Philis Nettle   Any Other Special Instructions Will Be Listed Below (If Applicable).  If you need a refill on your cardiac medications before your next appointment, please call your pharmacy.

## 2023-02-03 NOTE — Progress Notes (Signed)
Office Visit    Patient Name: Seth Werner Date of Encounter: 02/03/2023 PCP:  Catalina Lunger, DO [facility]  Cardiologist:  Dina Rich, MD  Advanced Practice Provider:  No care team member to display Electrophysiologist:  None   Chief Complaint    Seth Werner is a 67 y.o. male with a hx of chest pain, anasarca, hyperlipidemia, hypertension, OSA, CKD stage IV, HLD, history of epigastric pain, and morbid obesity, who presents today for follow-up.  Last saw patient on Dec 03, 2022. Said around 2 months ago, he was "full of fluid."  Said his doctor scheduled to have him perform an echocardiogram and other testing soon.  Was doing much better recently, weight was trending down and breathing improved, denied any chest pain.  Continued to follow-up with nephrology closely.   Today he presents for follow-up. Doing well. Says he has upcoming home sleep study scheduled. Denies any chest pain, shortness of breath, palpitations, syncope, presyncope, dizziness, orthopnea, PND, swelling or significant weight changes, acute bleeding, or claudication. Weights have been stable.   EKGs/Labs/Other Studies Reviewed:   The following studies were reviewed today:   EKG:  EKG is not ordered today.    Echocardiogram 12/04/2022 [facility]): Summary  1. The left ventricle is normal in size with mildly increased wall thickness.  2. The left ventricular systolic function is normal, LVEF is visually estimated at 60-65%.  3. The left atrium is mildly dilated in size.  4. The right ventricle is normal in size, with normal systolic function.  5. IVC size and inspiratory change suggest low right atrial pressure. (<3 mmHg).  Myoview 08/2013: 1.  Normal study, no scar or ischemia. 2.  Normal LVEF 63%, no wall motion abnormalities.  Recent Labs: 06/10/2022: ALT 14; BUN 16; Creatinine, Ser 1.81; Potassium 4.7; Sodium 142; TSH 1.910 01/10/2023: Hemoglobin 11.8; Platelets 183  Recent  Lipid Panel    Component Value Date/Time   CHOL 131 06/10/2022 0832   TRIG 72 06/10/2022 0832   HDL 51 06/10/2022 0832   CHOLHDL 2.6 06/10/2022 0832   CHOLHDL 2.7 02/04/2020 0844   VLDL 8 06/24/2016 0838   LDLCALC 65 06/10/2022 0832   LDLCALC 60 02/04/2020 0844    Review of Systems    All other systems reviewed and are otherwise negative except as noted above.  Physical Exam    VS:  BP 128/82   Pulse 73   Wt 267 lb 9.6 oz (121.4 kg)   SpO2 95%   BMI 44.53 kg/m  , BMI Body mass index is 44.53 kg/m.  Wt Readings from Last 3 Encounters:  02/03/23 267 lb 9.6 oz (121.4 kg)  01/28/23 268 lb 3.2 oz (121.7 kg)  01/10/23 263 lb (119.3 kg)     GEN: Morbidly obese, 67 year old male in no acute distress. HEENT: normal. Neck: Supple, no JVD, carotid bruits, or masses. Cardiac: S1/S2, RRR, no murmurs, rubs, or gallops. No clubbing, cyanosis, edema.  Radials/PT 2+ and equal bilaterally.  Respiratory:  Respirations regular and unlabored, clear to auscultation bilaterally. MS: No deformity or atrophy. Skin: Warm and dry, no rash. Neuro:  Strength and sensation are intact. Psych: Normal affect.  Assessment & Plan    HFpEF Newly diagnosed, Stage C, NYHA class I symptoms. EF 60-65%. BNP mildly elevated. Euvolemic and well compensated on exam. Continue Bumex and Coreg. GDMT limited d/t CKD. Low sodium diet, fluid restriction <2L, and daily weights encouraged. Educated to contact our office for weight gain of  2 lbs overnight or 5 lbs in one week.  2.  Hypertension Blood pressure stable. Discussed to monitor BP at home at least 2 hours after medications and sitting for 5-10 minutes.  No medication changes at this time. Heart healthy diet and regular cardiovascular exercise encouraged.   3.  Hyperlipidemia LDL 50 from January 2024.  Continue atorvastatin. Heart healthy diet and regular cardiovascular exercise encouraged.   4.  CKD stage IV Recent labs revealed most recent serum  creatinine at 2.43 with a eGFR at 28.  He tells me this is closely being followed by nephrology.  Avoid nephrotoxic agents.  Encourage adequate hydration.  Continue follow-up with nephrology.  5.  Morbid obesity  Weight loss via diet and exercise encouraged. Discussed the impact being overweight would have on cardiovascular risk. Continue to follow-up with Endocrinology.   Disposition: Will provide refill per his request. Follow up in 3 months with Dina Rich, MD or APP.  Signed, Sharlene Dory, NP 02/03/2023, 3:59 PM [facility]

## 2023-02-04 ENCOUNTER — Inpatient Hospital Stay: Payer: PPO | Attending: Oncology | Admitting: Oncology

## 2023-02-04 ENCOUNTER — Inpatient Hospital Stay: Payer: PPO

## 2023-02-04 ENCOUNTER — Ambulatory Visit (HOSPITAL_COMMUNITY)
Admission: RE | Admit: 2023-02-04 | Discharge: 2023-02-04 | Disposition: A | Discharge status: Home / Self Care | Payer: PPO | Source: Ambulatory Visit | Attending: Hematology | Admitting: Hematology

## 2023-02-04 ENCOUNTER — Other Ambulatory Visit: Payer: Self-pay

## 2023-02-04 VITALS — BP 140/67 | HR 74 | Temp 97.7°F | Resp 18 | Wt 265.4 lb

## 2023-02-04 DIAGNOSIS — Z8546 Personal history of malignant neoplasm of prostate: Secondary | ICD-10-CM | POA: Insufficient documentation

## 2023-02-04 DIAGNOSIS — D472 Monoclonal gammopathy: Secondary | ICD-10-CM | POA: Insufficient documentation

## 2023-02-04 DIAGNOSIS — D649 Anemia, unspecified: Secondary | ICD-10-CM | POA: Diagnosis not present

## 2023-02-04 LAB — RENAL FUNCTION PANEL
Albumin: 3.7 g/dL (ref 3.5–5.0)
Anion gap: 7 (ref 5–15)
BUN: 25 mg/dL — ABNORMAL HIGH (ref 8–23)
CO2: 26 mmol/L (ref 22–32)
Calcium: 8.9 mg/dL (ref 8.9–10.3)
Chloride: 107 mmol/L (ref 98–111)
Creatinine, Ser: 2.33 mg/dL — ABNORMAL HIGH (ref 0.61–1.24)
GFR, Estimated: 30 mL/min — ABNORMAL LOW (ref >60)
Glucose, Bld: 133 mg/dL — ABNORMAL HIGH (ref 70–99)
Phosphorus: 4 mg/dL (ref 2.5–4.6)
Potassium: 4.3 mmol/L (ref 3.5–5.1)
Sodium: 140 mmol/L (ref 135–145)
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
## 2023-03-03 ENCOUNTER — Encounter (INDEPENDENT_AMBULATORY_CARE_PROVIDER_SITE_OTHER): Payer: PPO

## 2023-03-03 ENCOUNTER — Telehealth: Payer: Self-pay | Admitting: Pulmonary Disease

## 2023-03-03 DIAGNOSIS — G4733 Obstructive sleep apnea (adult) (pediatric): Secondary | ICD-10-CM | POA: Diagnosis not present

## 2023-03-03 NOTE — Telephone Encounter (Signed)
HST 02/14/23 >> AHI 14.9, SpO2 low 82%   Please inform him that his sleep study shows mild to moderate obstructive sleep apnea.  Please arrange for ROV with me or NP to discuss treatment options.

## 2023-03-07 ENCOUNTER — Other Ambulatory Visit: Payer: Self-pay | Admitting: Nurse Practitioner

## 2023-03-11 NOTE — Telephone Encounter (Signed)
Patient called to get sleep study results. Attempted to schedule an appointment for patient, but he is unable to complete mychart visit and does not want to go to [place]. Informed patient I would have to call him back after determining the plan for sleep patients in [place].

## 2023-05-06 ENCOUNTER — Ambulatory Visit: Payer: PPO | Attending: Nurse Practitioner | Admitting: Nurse Practitioner

## 2023-05-06 ENCOUNTER — Encounter: Payer: Self-pay | Admitting: Nurse Practitioner

## 2023-05-06 VITALS — BP 124/60 | HR 74 | Ht 65.0 in | Wt 269.6 lb

## 2023-05-06 DIAGNOSIS — I5032 Chronic diastolic (congestive) heart failure: Secondary | ICD-10-CM | POA: Diagnosis not present

## 2023-05-06 DIAGNOSIS — E785 Hyperlipidemia, unspecified: Secondary | ICD-10-CM | POA: Diagnosis not present

## 2023-05-06 DIAGNOSIS — N184 Chronic kidney disease, stage 4 (severe): Secondary | ICD-10-CM

## 2023-05-06 DIAGNOSIS — G4733 Obstructive sleep apnea (adult) (pediatric): Secondary | ICD-10-CM

## 2023-05-06 DIAGNOSIS — I10 Essential (primary) hypertension: Secondary | ICD-10-CM

## 2023-05-06 MED ORDER — BUMETANIDE 0.5 MG PO TABS: 0.2500 mg | ORAL_TABLET | Freq: Two times a day (BID) | ORAL | 1 refills | Status: DC

## 2023-05-06 MED ORDER — POTASSIUM CHLORIDE 20 MEQ PO PACK: 20.0000 meq | PACK | Freq: Two times a day (BID) | ORAL | 0 refills | Status: DC

## 2023-05-06 NOTE — Progress Notes (Signed)
Office Visit    Patient Name: Seth Werner Date of Encounter: 05/06/2023 PCP:  Catalina Lunger, DO [facility]  Cardiologist:  Dina Rich, MD  Advanced Practice Provider:  No care team member to display Electrophysiologist:  None   Chief Complaint    Seth Werner is a 67 y.o. male with a hx of chest pain, chronic HFpEF, hyperlipidemia, hypertension, OSA, CKD stage IV, HLD, history of epigastric pain, and morbid obesity, who presents today for follow-up.  I last saw him on February 03, 2023.  He was doing well at that time.  He had an upcoming home sleep study scheduled.  Denied any acute cardiac complaints or issues.  His weights are stable at home.  Sleep study revealed mild to moderate OSA.   Today he presents for follow-up.  He presents his weight log that shows stable weights.  Doing well and compliant with his medications.  Denies any acute cardiac plaints or concerns. Denies any chest pain, shortness of breath, palpitations, syncope, presyncope, dizziness, orthopnea, PND, swelling or significant weight changes, acute bleeding, or claudication.  Does have some sinus issues at night, states he will be discussing this with his PCP at upcoming office visit.   EKGs/Labs/Other Studies Reviewed:   The following studies were reviewed today:   EKG:  EKG is not ordered today.    Echocardiogram 12/04/2022 [facility]): Summary  1. The left ventricle is normal in size with mildly increased wall thickness.  2. The left ventricular systolic function is normal, LVEF is visually estimated at 60-65%.  3. The left atrium is mildly dilated in size.  4. The right ventricle is normal in size, with normal systolic function.  5. IVC size and inspiratory change suggest low right atrial pressure. (<3 mmHg).  Myoview 08/2013: 1.  Normal study, no scar or ischemia. 2.  Normal LVEF 63%, no wall motion abnormalities.  Review of Systems    All other systems reviewed and are  otherwise negative except as noted above.  Physical Exam    VS:  BP 124/60   Pulse 74   Ht 5\' 5"  (1.651 m)   Wt 269 lb 9.6 oz (122.3 kg)   SpO2 94%   BMI 44.86 kg/m  , BMI Body mass index is 44.86 kg/m.  Wt Readings from Last 3 Encounters:  05/06/23 269 lb 9.6 oz (122.3 kg)  02/04/23 265 lb 6.9 oz (120.4 kg)  02/03/23 267 lb 9.6 oz (121.4 kg)     GEN: Morbidly obese, 67 year old male in no acute distress. HEENT: normal. Neck: Supple, no JVD, carotid bruits, or masses. Cardiac: S1/S2, RRR, no murmurs, rubs, or gallops. No clubbing, cyanosis, edema.  Radials/PT 2+ and equal bilaterally.  Respiratory:  Respirations regular and unlabored, clear to auscultation bilaterally. MS: No deformity or atrophy. Skin: Warm and dry, no rash. Neuro:  Strength and sensation are intact. Psych: Normal affect.  Assessment & Plan    HFpEF  Stage C, NYHA class I symptoms. EF 60-65%.  Euvolemic and well compensated on exam.  Weights are stable.  Continue Bumex and Coreg. GDMT limited d/t CKD. Low sodium diet, fluid restriction <2L, and daily weights encouraged. Educated to contact our office for weight gain of 2 lbs overnight or 5 lbs in one week.  Have requested that his upcoming lab work with nephrology is faxed to our office.  2.  Hypertension Blood pressure stable. Discussed to monitor BP at home at least 2 hours after medications and sitting  for 5-10 minutes.  No medication changes at this time. Heart healthy diet and regular cardiovascular exercise encouraged.   3.  Hyperlipidemia LDL 50 from January 2024.  Continue atorvastatin. Heart healthy diet and regular cardiovascular exercise encouraged.   4.  CKD stage IV Recent labs revealed most recent serum creatinine at 2.33 with a eGFR at 30.  He tells me this is closely being followed by nephrology.  Avoid nephrotoxic agents.  Encourage adequate hydration.  Continue follow-up with nephrology.  He has upcoming lab work with nephrology soon, and I  have requested that this is faxed to our office when these results are available.  5.  Morbid obesity  Weight loss via diet and exercise encouraged. Discussed the impact being overweight would have on cardiovascular risk. Continue to follow-up with Endocrinology.   6. OSA Recent sleep study performed revealed mild to moderate OSA.  Continue to follow-up with pulmonology as scheduled for treatment options.   Disposition: Will provide refills per his request. Follow up in 6 months with Dina Rich, MD or APP.  Signed, Sharlene Dory, NP

## 2023-05-06 NOTE — Patient Instructions (Addendum)

## 2023-05-12 ENCOUNTER — Other Ambulatory Visit: Payer: Self-pay | Admitting: Nurse Practitioner

## 2023-06-16 ENCOUNTER — Encounter: Payer: Self-pay | Admitting: Nurse Practitioner

## 2023-06-16 ENCOUNTER — Ambulatory Visit: Payer: PPO | Admitting: Nurse Practitioner

## 2023-06-16 VITALS — BP 140/74 | HR 74 | Ht 65.0 in | Wt 265.4 lb

## 2023-06-16 DIAGNOSIS — Z7985 Long-term (current) use of injectable non-insulin antidiabetic drugs: Secondary | ICD-10-CM | POA: Diagnosis not present

## 2023-06-16 DIAGNOSIS — N183 Chronic kidney disease, stage 3 unspecified: Secondary | ICD-10-CM

## 2023-06-16 DIAGNOSIS — E1122 Type 2 diabetes mellitus with diabetic chronic kidney disease: Secondary | ICD-10-CM

## 2023-06-16 DIAGNOSIS — I10 Essential (primary) hypertension: Secondary | ICD-10-CM | POA: Diagnosis not present

## 2023-06-16 DIAGNOSIS — Z794 Long term (current) use of insulin: Secondary | ICD-10-CM

## 2023-06-16 DIAGNOSIS — E559 Vitamin D deficiency, unspecified: Secondary | ICD-10-CM | POA: Diagnosis not present

## 2023-06-16 DIAGNOSIS — Z7984 Long term (current) use of oral hypoglycemic drugs: Secondary | ICD-10-CM

## 2023-06-16 DIAGNOSIS — E782 Mixed hyperlipidemia: Secondary | ICD-10-CM

## 2023-06-16 LAB — POCT GLYCOSYLATED HEMOGLOBIN (HGB A1C): Hemoglobin A1C: 6.1 % — AB (ref 4.0–5.6)
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
## 2023-08-04 ENCOUNTER — Other Ambulatory Visit: Payer: Self-pay | Admitting: Nurse Practitioner

## 2023-08-07 ENCOUNTER — Other Ambulatory Visit: Payer: Self-pay | Admitting: Nurse Practitioner

## 2023-08-07 ENCOUNTER — Other Ambulatory Visit: Payer: Self-pay

## 2023-08-07 DIAGNOSIS — D472 Monoclonal gammopathy: Secondary | ICD-10-CM

## 2023-08-08 ENCOUNTER — Inpatient Hospital Stay: Payer: PPO | Attending: Oncology

## 2023-08-08 DIAGNOSIS — Z8546 Personal history of malignant neoplasm of prostate: Secondary | ICD-10-CM | POA: Diagnosis not present

## 2023-08-08 DIAGNOSIS — D472 Monoclonal gammopathy: Secondary | ICD-10-CM | POA: Diagnosis present

## 2023-08-08 DIAGNOSIS — Z923 Personal history of irradiation: Secondary | ICD-10-CM | POA: Insufficient documentation

## 2023-08-08 DIAGNOSIS — D649 Anemia, unspecified: Secondary | ICD-10-CM | POA: Diagnosis not present

## 2023-08-08 LAB — CBC WITH DIFFERENTIAL/PLATELET
Abs Immature Granulocytes: 0.01 10*3/uL (ref 0.00–0.07)
Basophils Absolute: 0 10*3/uL (ref 0.0–0.1)
Basophils Relative: 0 %
Eosinophils Absolute: 0.2 10*3/uL (ref 0.0–0.5)
Eosinophils Relative: 4 %
HCT: 35.3 % — ABNORMAL LOW (ref 39.0–52.0)
Hemoglobin: 11.4 g/dL — ABNORMAL LOW (ref 13.0–17.0)
Immature Granulocytes: 0 %
Lymphocytes Relative: 35 %
Lymphs Abs: 1.6 10*3/uL (ref 0.7–4.0)
MCH: 30.2 pg (ref 26.0–34.0)
MCHC: 32.3 g/dL (ref 30.0–36.0)
MCV: 93.4 fL (ref 80.0–100.0)
Monocytes Absolute: 0.3 10*3/uL (ref 0.1–1.0)
Monocytes Relative: 7 %
Neutro Abs: 2.5 10*3/uL (ref 1.7–7.7)
Neutrophils Relative %: 54 %
Platelets: 165 10*3/uL (ref 150–400)
RBC: 3.78 MIL/uL — ABNORMAL LOW (ref 4.22–5.81)
RDW: 13.2 % (ref 11.5–15.5)
WBC: 4.6 10*3/uL (ref 4.0–10.5)
nRBC: 0 % (ref 0.0–0.2)

## 2023-08-08 LAB — COMPREHENSIVE METABOLIC PANEL
ALT: 15 U/L (ref 0–44)
AST: 20 U/L (ref 15–41)
Albumin: 3.4 g/dL — ABNORMAL LOW (ref 3.5–5.0)
Alkaline Phosphatase: 67 U/L (ref 38–126)
Anion gap: 5 (ref 5–15)
BUN: 20 mg/dL (ref 8–23)
CO2: 25 mmol/L (ref 22–32)
Calcium: 8.5 mg/dL — ABNORMAL LOW (ref 8.9–10.3)
Chloride: 109 mmol/L (ref 98–111)
Creatinine, Ser: 2.28 mg/dL — ABNORMAL HIGH (ref 0.61–1.24)
GFR, Estimated: 30 mL/min — ABNORMAL LOW (ref >60)
Glucose, Bld: 125 mg/dL — ABNORMAL HIGH (ref 70–99)
Potassium: 4.4 mmol/L (ref 3.5–5.1)
Sodium: 139 mmol/L (ref 135–145)
Total Bilirubin: 0.7 mg/dL (ref 0.0–1.2)
Total Protein: 6.5 g/dL (ref 6.5–8.1)

## 2023-08-08 LAB — FERRITIN: Ferritin: 139 ng/mL (ref 24–336)

## 2023-08-08 LAB — IRON AND TIBC
Iron: 57 ug/dL (ref 45–182)
Saturation Ratios: 26 % (ref 17.9–39.5)
TIBC: 219 ug/dL — ABNORMAL LOW (ref 250–450)
UIBC: 162 ug/dL

## 2023-08-08 LAB — LACTATE DEHYDROGENASE: LDH: 147 U/L (ref 98–192)

## 2023-08-11 LAB — KAPPA/LAMBDA LIGHT CHAINS
Kappa free light chain: 59.9 mg/L — ABNORMAL HIGH (ref 3.3–19.4)
Kappa, lambda light chain ratio: 1.98 — ABNORMAL HIGH (ref 0.26–1.65)
Lambda free light chains: 30.2 mg/L — ABNORMAL HIGH (ref 5.7–26.3)

## 2023-08-13 ENCOUNTER — Other Ambulatory Visit: Payer: Self-pay | Admitting: Nurse Practitioner

## 2023-08-13 ENCOUNTER — Other Ambulatory Visit: Payer: Self-pay

## 2023-08-13 MED ORDER — TRESIBA FLEXTOUCH 100 UNIT/ML ~~LOC~~ SOPN: 30.0000 [IU] | PEN_INJECTOR | Freq: Every day | SUBCUTANEOUS | 1 refills | Status: DC

## 2023-08-13 NOTE — Telephone Encounter (Signed)
Pt is calling his insurance to see what is covered to replace his Higher education careers adviser. That is no longer covered

## 2023-08-14 LAB — PROTEIN ELECTROPHORESIS, SERUM
A/G Ratio: 1.2 (ref 0.7–1.7)
Albumin ELP: 3.2 g/dL (ref 2.9–4.4)
Alpha-1-Globulin: 0.2 g/dL (ref 0.0–0.4)
Alpha-2-Globulin: 0.6 g/dL (ref 0.4–1.0)
Beta Globulin: 0.9 g/dL (ref 0.7–1.3)
Gamma Globulin: 0.9 g/dL (ref 0.4–1.8)
Globulin, Total: 2.6 g/dL (ref 2.2–3.9)
Total Protein ELP: 5.8 g/dL — ABNORMAL LOW (ref 6.0–8.5)

## 2023-08-15 ENCOUNTER — Other Ambulatory Visit: Payer: Self-pay

## 2023-08-15 ENCOUNTER — Inpatient Hospital Stay: Payer: PPO | Admitting: Oncology

## 2023-08-15 VITALS — BP 116/53 | HR 71 | Temp 98.1°F | Resp 18 | Wt 271.6 lb

## 2023-08-15 DIAGNOSIS — D472 Monoclonal gammopathy: Secondary | ICD-10-CM | POA: Diagnosis not present

## 2023-08-15 DIAGNOSIS — N1831 Chronic kidney disease, stage 3a: Secondary | ICD-10-CM

## 2023-08-15 MED ORDER — LANTUS SOLOSTAR 100 UNIT/ML ~~LOC~~ SOPN: 30.0000 [IU] | PEN_INJECTOR | Freq: Every day | SUBCUTANEOUS | 2 refills | Status: DC
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
## 2023-08-17 LAB — IMMUNOFIXATION ELECTROPHORESIS
IgA: 177 mg/dL (ref 61–437)
IgG (Immunoglobin G), Serum: 1045 mg/dL (ref 603–1613)
IgM (Immunoglobulin M), Srm: 7 mg/dL — ABNORMAL LOW (ref 20–172)
Total Protein ELP: 5.8 g/dL — ABNORMAL LOW (ref 6.0–8.5)

## 2023-08-18 ENCOUNTER — Other Ambulatory Visit: Payer: Self-pay | Admitting: Nurse Practitioner

## 2023-10-14 ENCOUNTER — Other Ambulatory Visit: Payer: Self-pay | Admitting: Nurse Practitioner

## 2023-11-27 ENCOUNTER — Other Ambulatory Visit: Payer: Self-pay | Admitting: Nurse Practitioner

## 2023-12-01 ENCOUNTER — Encounter: Payer: Self-pay | Admitting: Nurse Practitioner

## 2023-12-01 ENCOUNTER — Ambulatory Visit: Attending: Nurse Practitioner | Admitting: Nurse Practitioner

## 2023-12-01 VITALS — BP 124/62 | HR 76 | Ht 65.0 in | Wt 275.0 lb

## 2023-12-01 DIAGNOSIS — N184 Chronic kidney disease, stage 4 (severe): Secondary | ICD-10-CM | POA: Diagnosis not present

## 2023-12-01 DIAGNOSIS — I10 Essential (primary) hypertension: Secondary | ICD-10-CM

## 2023-12-01 DIAGNOSIS — I5032 Chronic diastolic (congestive) heart failure: Secondary | ICD-10-CM

## 2023-12-01 DIAGNOSIS — G4733 Obstructive sleep apnea (adult) (pediatric): Secondary | ICD-10-CM

## 2023-12-01 DIAGNOSIS — E785 Hyperlipidemia, unspecified: Secondary | ICD-10-CM | POA: Diagnosis not present

## 2023-12-01 NOTE — Progress Notes (Unsigned)
 Office Visit    Patient Name: Seth Werner Date of Encounter: 12/01/2023 PCP:  Avelina Bode, DO [facility]  Cardiologist:  Armida Lander, MD  Advanced Practice Provider:  No care team member to display Electrophysiologist:  None   Chief Complaint    Seth Werner is a 68 y.o. male with a hx of chest pain, chronic HFpEF, hyperlipidemia, hypertension, OSA, CKD stage IV, HLD, history of epigastric pain, and morbid obesity, who presents today for follow-up.  I last saw him on February 03, 2023.  He was doing well at that time.  He had an upcoming home sleep study scheduled.  Denied any acute cardiac complaints or issues.  His weights are stable at home.  Sleep study revealed mild to moderate OSA.   05/06/2023 - Today he presents for follow-up.  He presents his weight log that shows stable weights.  Doing well and compliant with his medications.  Denies any acute cardiac plaints or concerns. Denies any chest pain, shortness of breath, palpitations, syncope, presyncope, dizziness, orthopnea, PND, swelling or significant weight changes, acute bleeding, or claudication.  Does have some sinus issues at night, states he will be discussing this with his PCP at upcoming office visit.  12/01/2023 - Doing well.  Denies any acute cardiac complaints or issues.  Tells me he is on a new cholesterol medication that is a powder consistency.  He cannot remember the name of this medicine.  Tells me he is no longer taking atorvastatin . Denies any chest pain, shortness of breath, palpitations, syncope, presyncope, dizziness, orthopnea, PND, swelling or significant weight changes, acute bleeding, or claudication.   EKGs/Labs/Other Studies Reviewed:   The following studies were reviewed today:   EKG:   EKG Interpretation Date/Time:  Monday Dec 01 2023 13:02:29 EDT Ventricular Rate:  79 PR Interval:  178 QRS Duration:  72 QT Interval:  366 QTC Calculation: 419 R Axis:   49  Text  Interpretation: Normal sinus rhythm T wave abnormality, consider inferior ischemia When compared with ECG of 23-Aug-2013 08:22, Premature ventricular complexes are no longer Present Confirmed by Lasalle Pointer [phone]) on 12/01/2023 1:09:08 PM   Echocardiogram 12/04/2022 [facility]): Summary  1. The left ventricle is normal in size with mildly increased wall thickness.  2. The left ventricular systolic function is normal, LVEF is visually estimated at 60-65%.  3. The left atrium is mildly dilated in size.  4. The right ventricle is normal in size, with normal systolic function.  5. IVC size and inspiratory change suggest low right atrial pressure. (<3 mmHg).  Myoview 08/2013: 1.  Normal study, no scar or ischemia. 2.  Normal LVEF 63%, no wall motion abnormalities.  Review of Systems    All other systems reviewed and are otherwise negative except as noted above.  Physical Exam    VS:  BP 124/62   Pulse 76   Ht 5\' 5"  (1.651 m)   Wt 275 lb (124.7 kg)   SpO2 95%   BMI 45.76 kg/m  , BMI Body mass index is 45.76 kg/m.  Wt Readings from Last 3 Encounters:  12/01/23 275 lb (124.7 kg)  08/15/23 271 lb 9.7 oz (123.2 kg)  06/16/23 265 lb 6.4 oz (120.4 kg)     GEN: Morbidly obese, 68 year old male in no acute distress. HEENT: normal. Neck: Supple, no JVD, carotid bruits, or masses. Cardiac: S1/S2, RRR, no murmurs, rubs, or gallops. No clubbing, cyanosis, edema.  Radials/PT 2+ and equal bilaterally.  Respiratory:  Respirations regular and unlabored, clear to auscultation bilaterally. MS: No deformity or atrophy. Skin: Warm and dry, no rash. Neuro:  Strength and sensation are intact. Psych: Normal affect.  Assessment & Plan    HFpEF  Stage C, NYHA class I symptoms. EF 60-65%.  Difficult to ascertain volume status due to his BMI.  Overall appears euvolemic and well compensated on exam.  Weights are stable.  Continue Bumex  and Coreg . GDMT limited d/t CKD. Low sodium diet, fluid restriction  <2L, and daily weights encouraged. Educated to contact our office for weight gain of 2 lbs overnight or 5 lbs in one week.  Will request most recent labs from PCP's office.  2.  Hypertension Blood pressure stable. Discussed to monitor BP at home at least 2 hours after medications and sitting for 5-10 minutes.  No medication changes at this time. Heart healthy diet and regular cardiovascular exercise encouraged.   3.  Hyperlipidemia LDL 50 from January 2024.  No longer taking atorvastatin  per his report.  He is currently taking a powder medication-I have asked him to confirm with our office what the name of this medicine is. He verbalized understanding.  Heart healthy diet and regular cardiovascular exercise encouraged.   4.  CKD stage IV Most recent labs from March 2025 are overall stable.  Followed by nephrology.  Avoid nephrotoxic agents.  Encourage adequate hydration.  Continue follow-up with nephrology.  Will request most recent labs.  5.  Morbid obesity  Weight loss via diet and exercise encouraged. Discussed the impact being overweight would have on cardiovascular risk. Continue to follow-up with Endocrinology.   6. OSA Encouraged continued compliance.  Continue to follow-up with pulmonology.   Disposition: Follow up in 6 months with Armida Lander, MD or APP.  Signed, Lasalle Pointer, NP

## 2023-12-01 NOTE — Patient Instructions (Signed)

## 2023-12-05 ENCOUNTER — Telehealth: Payer: Self-pay | Admitting: Cardiology

## 2023-12-05 NOTE — Telephone Encounter (Signed)
 Pt wanted Nellie Banas to know the powdered medication he takes in the KLOR-CON  20 MEQ packet.   He said he couldn't think of the name of it at his last visit but, I let him know it was listed as one of his current medications.

## 2023-12-10 NOTE — Telephone Encounter (Signed)
 Patient is currently not taking atorvastatin 

## 2023-12-10 NOTE — Telephone Encounter (Signed)
 Reflected on list.

## 2023-12-11 ENCOUNTER — Other Ambulatory Visit: Payer: PPO

## 2023-12-11 NOTE — Telephone Encounter (Signed)
 Spoke with patient he is taking Atorvastatin  40 mg and his Kidney doctor took him off the pill form of Potassium and changed him to powder packets. Stated that he mixed up the medications. Advised him will send over to provider to see if she needed to make any changes

## 2023-12-11 NOTE — Telephone Encounter (Signed)
 Left message for patient to give [place] a call back.

## 2023-12-11 NOTE — Telephone Encounter (Signed)
Patient returned staff call. 

## 2023-12-12 ENCOUNTER — Telehealth: Payer: Self-pay | Admitting: Urology

## 2023-12-12 ENCOUNTER — Other Ambulatory Visit: Payer: Self-pay | Admitting: Urology

## 2023-12-12 ENCOUNTER — Other Ambulatory Visit: Payer: PPO

## 2023-12-12 DIAGNOSIS — R399 Unspecified symptoms and signs involving the genitourinary system: Secondary | ICD-10-CM

## 2023-12-12 DIAGNOSIS — N390 Urinary tract infection, site not specified: Secondary | ICD-10-CM

## 2023-12-12 DIAGNOSIS — Z8546 Personal history of malignant neoplasm of prostate: Secondary | ICD-10-CM

## 2023-12-12 DIAGNOSIS — R8290 Unspecified abnormal findings in urine: Secondary | ICD-10-CM

## 2023-12-12 LAB — URINALYSIS, ROUTINE W REFLEX MICROSCOPIC
Bilirubin, UA: NEGATIVE
Glucose, UA: NEGATIVE
Ketones, UA: NEGATIVE
Nitrite, UA: NEGATIVE
Specific Gravity, UA: 1.015 (ref 1.005–1.030)
Urobilinogen, Ur: 0.2 mg/dL (ref 0.2–1.0)
pH, UA: 6 (ref 5.0–7.5)

## 2023-12-12 LAB — MICROSCOPIC EXAMINATION: WBC, UA: >30 /HPF — AB (ref 0–5)

## 2023-12-12 MED ORDER — DOXYCYCLINE HYCLATE 100 MG PO CAPS
100.0000 mg | ORAL_CAPSULE | Freq: Two times a day (BID) | ORAL | 0 refills | Status: DC
Start: 2023-12-12 — End: 2023-12-17

## 2023-12-12 NOTE — Telephone Encounter (Signed)
 Patient does self cath and has a bad smell Dysuria  Patient called with c/o dysuria x 2-3 days days.  Pain:   Severity:1/10  Associated Signs and Symptoms:  Fever: noTemp. Chills: no Hematuria: no Urgency: no Frequency: no Hesitancy:no Incontinence: no Nausea: no Vomiting: no  Message sent to clinic staff to return call to patient to advise of plan.

## 2023-12-12 NOTE — Telephone Encounter (Signed)
 Patient scheduled for lab urine check. Will have Np review results and will follow up with patient.

## 2023-12-12 NOTE — Telephone Encounter (Addendum)
"  UTI symptoms with abnormal drop off urinalysis. Doxycycline sent in for empiric treatment while awaiting urine culture result." Patient is made aware via detailed voiced message on the status of UA results. Patient is made aware UA was infected and Doxycycline BID X 7 days was sent to patient pharmacy.

## 2023-12-13 LAB — PSA: Prostate Specific Ag, Serum: 0.3 ng/mL (ref 0.0–4.0)

## 2023-12-15 LAB — URINE CULTURE

## 2023-12-16 ENCOUNTER — Ambulatory Visit: Payer: Self-pay

## 2023-12-16 ENCOUNTER — Ambulatory Visit: Payer: PPO | Admitting: Nurse Practitioner

## 2023-12-16 DIAGNOSIS — I10 Essential (primary) hypertension: Secondary | ICD-10-CM

## 2023-12-16 DIAGNOSIS — Z794 Long term (current) use of insulin: Secondary | ICD-10-CM

## 2023-12-16 DIAGNOSIS — E559 Vitamin D deficiency, unspecified: Secondary | ICD-10-CM

## 2023-12-16 DIAGNOSIS — N183 Chronic kidney disease, stage 3 unspecified: Secondary | ICD-10-CM

## 2023-12-16 DIAGNOSIS — Z7985 Long-term (current) use of injectable non-insulin antidiabetic drugs: Secondary | ICD-10-CM

## 2023-12-16 DIAGNOSIS — E782 Mixed hyperlipidemia: Secondary | ICD-10-CM

## 2023-12-17 ENCOUNTER — Ambulatory Visit: Payer: Self-pay | Admitting: Urology

## 2023-12-17 DIAGNOSIS — N390 Urinary tract infection, site not specified: Secondary | ICD-10-CM

## 2023-12-17 MED ORDER — SULFAMETHOXAZOLE-TRIMETHOPRIM 400-80 MG PO TABS
1.0000 | ORAL_TABLET | Freq: Two times a day (BID) | ORAL | 0 refills | Status: AC
Start: 2023-12-17 — End: ?

## 2023-12-17 NOTE — Progress Notes (Signed)
 Please let pt know urine culture was positive but resistant to the antibiotic class prescribed. Bactrim  being sent in today as alternative based on culture sensitivities.

## 2023-12-17 NOTE — Telephone Encounter (Signed)
Patient is made aware via voiced message.

## 2023-12-17 NOTE — Telephone Encounter (Signed)
-----   Message from Lauretta Ponto sent at 12/17/2023 11:14 AM EDT ----- Please let pt know urine culture was positive but resistant to the antibiotic class prescribed. Bactrim  being sent in today as alternative based on culture sensitivities.

## 2023-12-18 ENCOUNTER — Ambulatory Visit: Payer: Self-pay

## 2023-12-18 ENCOUNTER — Telehealth: Payer: Self-pay | Admitting: Nurse Practitioner

## 2023-12-18 ENCOUNTER — Telehealth: Payer: Self-pay

## 2023-12-18 ENCOUNTER — Encounter: Payer: Self-pay | Admitting: Urology

## 2023-12-18 ENCOUNTER — Other Ambulatory Visit (HOSPITAL_COMMUNITY): Payer: Self-pay

## 2023-12-18 ENCOUNTER — Ambulatory Visit: Payer: PPO | Admitting: Urology

## 2023-12-18 VITALS — BP 136/76 | HR 76

## 2023-12-18 DIAGNOSIS — N35813 Other membranous urethral stricture, male: Secondary | ICD-10-CM

## 2023-12-18 DIAGNOSIS — N390 Urinary tract infection, site not specified: Secondary | ICD-10-CM

## 2023-12-18 DIAGNOSIS — Z8546 Personal history of malignant neoplasm of prostate: Secondary | ICD-10-CM

## 2023-12-18 LAB — URINALYSIS, ROUTINE W REFLEX MICROSCOPIC
Bilirubin, UA: NEGATIVE
Glucose, UA: NEGATIVE
Ketones, UA: NEGATIVE
Nitrite, UA: NEGATIVE
RBC, UA: NEGATIVE
Specific Gravity, UA: 1.015 (ref 1.005–1.030)
Urobilinogen, Ur: 0.2 mg/dL (ref 0.2–1.0)
pH, UA: 6 (ref 5.0–7.5)

## 2023-12-18 LAB — MICROSCOPIC EXAMINATION: Bacteria, UA: NONE SEEN

## 2023-12-18 MED ORDER — TRULICITY 1.5 MG/0.5ML ~~LOC~~ SOAJ: 1.5000 mg | SUBCUTANEOUS | 3 refills | Status: DC

## 2023-12-18 NOTE — Telephone Encounter (Signed)
 Pharmacy Patient Advocate Encounter   Received notification from Pt Calls Messages that prior authorization for Trulicity  is required/requested.   Insurance verification completed.   The patient is insured through [facility] ADVANTAGE/RX ADVANCE .   Per test claim: PA required and submitted KEY/EOC/Request #: [id number] from 12/18/23 to 12/17/24   Please send new script as the one on file expired 12/12/23.

## 2023-12-18 NOTE — Telephone Encounter (Signed)
 I just got communication from the PA team his PA went through, perhaps this was what the hold up was.  I will resend now, too.
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
## 2023-12-18 NOTE — Telephone Encounter (Signed)
 Pt states a prior Authorization is needed for Trulicity .  Can you see this?

## 2023-12-18 NOTE — Telephone Encounter (Signed)
 New RX has been put in.  Can you check on this now?

## 2023-12-18 NOTE — Telephone Encounter (Signed)
 Pt is stating he needs a refill of trulicity 

## 2023-12-19 ENCOUNTER — Other Ambulatory Visit (HOSPITAL_COMMUNITY): Payer: Self-pay

## 2023-12-19 NOTE — Telephone Encounter (Signed)
Called pt and let pt know

## 2023-12-22 ENCOUNTER — Encounter: Payer: Self-pay | Admitting: Nurse Practitioner

## 2023-12-22 ENCOUNTER — Ambulatory Visit (INDEPENDENT_AMBULATORY_CARE_PROVIDER_SITE_OTHER): Admitting: Nurse Practitioner

## 2023-12-22 VITALS — BP 112/60 | HR 81 | Ht 65.0 in | Wt 269.8 lb

## 2023-12-22 DIAGNOSIS — E782 Mixed hyperlipidemia: Secondary | ICD-10-CM

## 2023-12-22 DIAGNOSIS — Z794 Long term (current) use of insulin: Secondary | ICD-10-CM | POA: Diagnosis not present

## 2023-12-22 DIAGNOSIS — Z7985 Long-term (current) use of injectable non-insulin antidiabetic drugs: Secondary | ICD-10-CM

## 2023-12-22 DIAGNOSIS — N183 Chronic kidney disease, stage 3 unspecified: Secondary | ICD-10-CM

## 2023-12-22 DIAGNOSIS — I10 Essential (primary) hypertension: Secondary | ICD-10-CM

## 2023-12-22 DIAGNOSIS — E1122 Type 2 diabetes mellitus with diabetic chronic kidney disease: Secondary | ICD-10-CM | POA: Diagnosis not present

## 2023-12-22 DIAGNOSIS — E559 Vitamin D deficiency, unspecified: Secondary | ICD-10-CM | POA: Diagnosis not present

## 2023-12-22 LAB — POCT GLYCOSYLATED HEMOGLOBIN (HGB A1C): Hemoglobin A1C: 5.9 % — AB (ref 4.0–5.6)

## 2023-12-22 MED ORDER — TRESIBA FLEXTOUCH 100 UNIT/ML ~~LOC~~ SOPN: 20.0000 [IU] | PEN_INJECTOR | Freq: Every day | SUBCUTANEOUS | 1 refills | Status: DC

## 2023-12-22 MED ORDER — BD PEN NEEDLE NANO 2ND GEN 32G X 4 MM MISC: 3 refills | Status: AC

## 2023-12-22 MED ORDER — ONETOUCH VERIO VI STRP: ORAL_STRIP | 2 refills | Status: DC

## 2023-12-22 MED ORDER — ONETOUCH ULTRASOFT LANCETS MISC: 3 refills | Status: AC

## 2023-12-22 MED ORDER — TRULICITY 3 MG/0.5ML ~~LOC~~ SOAJ: 3.0000 mg | SUBCUTANEOUS | 1 refills | Status: DC

## 2023-12-22 NOTE — Telephone Encounter (Signed)
 Addressed in new encounter.
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
## 2024-01-16 ENCOUNTER — Emergency Department (HOSPITAL_COMMUNITY)

## 2024-01-16 ENCOUNTER — Encounter (HOSPITAL_COMMUNITY): Payer: Self-pay | Admitting: Emergency Medicine

## 2024-01-16 ENCOUNTER — Other Ambulatory Visit: Payer: Self-pay

## 2024-01-16 ENCOUNTER — Emergency Department (HOSPITAL_COMMUNITY)
Admission: EM | Admit: 2024-01-16 | Discharge: 2024-01-16 | Disposition: A | Discharge status: Home / Self Care | Attending: Emergency Medicine | Admitting: Emergency Medicine

## 2024-01-16 DIAGNOSIS — R42 Dizziness and giddiness: Secondary | ICD-10-CM | POA: Insufficient documentation

## 2024-01-16 LAB — CBC
HCT: 34.6 % — ABNORMAL LOW (ref 39.0–52.0)
Hemoglobin: 11.4 g/dL — ABNORMAL LOW (ref 13.0–17.0)
MCH: 30.3 pg (ref 26.0–34.0)
MCHC: 32.9 g/dL (ref 30.0–36.0)
MCV: 92 fL (ref 80.0–100.0)
Platelets: 171 10*3/uL (ref 150–400)
RBC: 3.76 MIL/uL — ABNORMAL LOW (ref 4.22–5.81)
RDW: 14.7 % (ref 11.5–15.5)
WBC: 4.2 10*3/uL (ref 4.0–10.5)
nRBC: 0 % (ref 0.0–0.2)

## 2024-01-16 LAB — COMPREHENSIVE METABOLIC PANEL WITH GFR
ALT: 15 U/L (ref 0–44)
AST: 21 U/L (ref 15–41)
Albumin: 3.6 g/dL (ref 3.5–5.0)
Alkaline Phosphatase: 68 U/L (ref 38–126)
Anion gap: 11 (ref 5–15)
BUN: 19 mg/dL (ref 8–23)
CO2: 22 mmol/L (ref 22–32)
Calcium: 8.6 mg/dL — ABNORMAL LOW (ref 8.9–10.3)
Chloride: 107 mmol/L (ref 98–111)
Creatinine, Ser: 2.31 mg/dL — ABNORMAL HIGH (ref 0.61–1.24)
GFR, Estimated: 30 mL/min — ABNORMAL LOW (ref >60)
Glucose, Bld: 143 mg/dL — ABNORMAL HIGH (ref 70–99)
Potassium: 4.1 mmol/L (ref 3.5–5.1)
Sodium: 140 mmol/L (ref 135–145)
Total Bilirubin: 0.5 mg/dL (ref 0.0–1.2)
Total Protein: 6.8 g/dL (ref 6.5–8.1)

## 2024-01-16 LAB — URINALYSIS, ROUTINE W REFLEX MICROSCOPIC
Bacteria, UA: NONE SEEN
Bilirubin Urine: NEGATIVE
Glucose, UA: NEGATIVE mg/dL
Hgb urine dipstick: NEGATIVE
Ketones, ur: NEGATIVE mg/dL
Nitrite: NEGATIVE
Protein, ur: 100 mg/dL — AB
Specific Gravity, Urine: 1.011 (ref 1.005–1.030)
pH: 5 (ref 5.0–8.0)

## 2024-01-16 LAB — TROPONIN I (HIGH SENSITIVITY)
Troponin I (High Sensitivity): 2 ng/L (ref <18)
Troponin I (High Sensitivity): 2 ng/L (ref <18)

## 2024-01-16 LAB — CBG MONITORING, ED: Glucose-Capillary: 126 mg/dL — ABNORMAL HIGH (ref 70–99)

## 2024-01-16 MED ORDER — LACTATED RINGERS IV BOLUS
1000.0000 mL | Freq: Once | INTRAVENOUS | Status: AC
  Administered 2024-01-16: 1000 mL via INTRAVENOUS

## 2024-01-16 NOTE — ED Notes (Signed)
Unable to give urine at this time

## 2024-01-16 NOTE — Discharge Instructions (Signed)
 Return to the ER if you develop new or worsening lightheadedness, chest pain, headache, or any other new/concerning symptoms.

## 2024-01-16 NOTE — ED Triage Notes (Signed)
 Pt in with reported near syncopal episode that occurred around 3:30pm. Pt states he had been working outside some, and also sitting in a hot car for a little while earlier this morning. Has DM, and his CBG was 100 earlier today. States that his head felt tight during the episode earlier today. Denies any cp or sob

## 2024-01-16 NOTE — ED Provider Notes (Signed)
 [place] EMERGENCY DEPARTMENT AT [facility] Provider Note   CSN: [id number] Arrival date & time: 01/16/24  1927     Patient presents with: Near Syncope   Seth Werner is a 68 y.o. male.  {Add pertinent medical, surgical, social history, OB history to HPI:6443} HPI 68 year old male presents with lightheadedness.  Around 3 PM he was standing in her room that was hot where an air conditioner was being replaced and he started to feel lightheaded.  After that he started to feel a tightness in his head but is not a headache.  No vision changes.  No shortness of breath, focal weakness or numbness, or chest pain.  However he states about an hour ago he started getting a burning in his chest that feels like reflux, which he states he gets.  He denies any room spinning sensation or trouble walking.  Prior to Admission medications   Medication Sig Start Date End Date Taking? Authorizing Provider  amLODipine  (NORVASC ) 10 MG tablet Take 10 mg by mouth daily.     [provider]  atorvastatin  (LIPITOR) 40 MG tablet TAKE ONE TABLET BY MOUTH DAILY Patient not taking: Reported on 12/01/2023 11/22/15   Alvan Dorn FALCON, MD  Besifloxacin HCl (BESIVANCE) 0.6 % SUSP Administer 1 drop into the left eye. Once Every 7 weeks 03/27/18   [provider]  Blood Glucose Monitoring Suppl (ONE TOUCH ULTRA MINI) w/Device KIT USE TO CHECK SUGAR 09/04/21   Therisa Benton PARAS, NP  bumetanide  (BUMEX ) 0.5 MG tablet Take 0.5 tablets (0.25 mg total) by mouth 2 (two) times daily. 05/06/23   Miriam Norris, NP  carvedilol  (COREG ) 25 MG tablet Take 25 mg by mouth 2 (two) times daily with a meal.    [provider]  Dulaglutide  (TRULICITY ) 3 MG/0.5ML SOAJ Inject 3 mg as directed once a week. 12/22/23   Therisa Benton PARAS, NP  fluticasone (FLONASE) 50 MCG/ACT nasal spray     [provider]  glucose blood (ONETOUCH ULTRA) test strip USE TO CHECK BLOOD GLUCOSE TWO TIMES DAILY 06/08/20    Nida, Gebreselassie W, MD  glucose blood (ONETOUCH VERIO) test strip USE TO CHECK BLOOD SUGAR TWICE DAILY BEFORE BREAKFAST AND BEFORE BEDTIME 12/22/23   Therisa Benton PARAS, NP  hydrALAZINE  (APRESOLINE ) 10 MG tablet Take 10 mg by mouth 2 (two) times daily. 10/09/23   [provider]  HYDROcodone -acetaminophen  (NORCO/VICODIN) 5-325 MG tablet Take 0.5 tablets by mouth every 6 (six) hours as needed for moderate pain.    [provider]  insulin  degludec (TRESIBA  FLEXTOUCH) 100 UNIT/ML FlexTouch Pen Inject 20 Units into the skin at bedtime. 12/22/23   Therisa Benton PARAS, NP  Insulin  Pen Needle (BD PEN NEEDLE NANO 2ND GEN) 32G X 4 MM MISC USE TO INJECT INSULIN  once daily 12/22/23   Therisa Benton PARAS, NP  KLOR-CON  20 MEQ packet TAKE 1 PACKET BY MOUTH TWICE DAILY 08/04/23   Alvan Dorn FALCON, MD  Lancets [facility] ULTRASOFT) lancets Use as instructed to check glucose twice daily 12/22/23   Therisa Benton PARAS, NP  loratadine (CLARITIN) 10 MG tablet Take 10 mg by mouth daily.    [provider]  losartan  (COZAAR ) 50 MG tablet Take 50 mg by mouth daily. 09/22/23   [provider]  Multiple Vitamins-Minerals (MULTIVITAMIN ADULT PO) Take 1 tablet by mouth daily.     [provider]  nitroGLYCERIN  (NITROSTAT ) 0.4 MG SL tablet Place 1 tablet (0.4 mg total) under the tongue every 5 (  five) minutes as needed for chest pain. 02/03/23   Miriam Norris, NP  omeprazole (PRILOSEC) 20 MG capsule Take 20 mg by mouth daily. 09/22/20   [provider]  sulfamethoxazole -trimethoprim  (BACTRIM ) 400-80 MG tablet Take 1 tablet by mouth 2 (two) times daily. 12/17/23   Larocco, Sarah C, FNP    Allergies: Patient has no known allergies.    Review of Systems  Constitutional:  Negative for fever.  Eyes:  Negative for visual disturbance.  Respiratory:  Negative for shortness of breath.   Cardiovascular:  Negative for chest pain.  Neurological:  Positive for light-headedness. Negative for  syncope, weakness, numbness and headaches.    Updated Vital Signs BP (!) 153/68   Pulse 70   Temp 98.2 F (36.8 C) (Oral)   Resp 14   Wt 122.4 kg   SpO2 98%   BMI 44.90 kg/m   Physical Exam Vitals and nursing note reviewed.  Constitutional:      General: He is not in acute distress.    Appearance: He is well-developed. He is not ill-appearing or diaphoretic.  HENT:     Head: Normocephalic and atraumatic.   Eyes:     Extraocular Movements: Extraocular movements intact.     Pupils: Pupils are equal, round, and reactive to light.     Comments: Grossly normal visual fields.   Cardiovascular:     Rate and Rhythm: Normal rate and regular rhythm.     Heart sounds: Normal heart sounds.  Pulmonary:     Effort: Pulmonary effort is normal.     Breath sounds: Normal breath sounds.  Abdominal:     General: There is no distension.     Palpations: Abdomen is soft.     Tenderness: There is no abdominal tenderness.   Skin:    General: Skin is warm and dry.   Neurological:     Mental Status: He is alert.     Comments: CN 3-12 grossly intact. 5/5 strength in all 4 extremities. Grossly normal sensation. Normal finger to nose.     (all labs ordered are listed, but only abnormal results are displayed) Labs Reviewed  COMPREHENSIVE METABOLIC PANEL WITH GFR  CBC  URINALYSIS, ROUTINE W REFLEX MICROSCOPIC  CBG MONITORING, ED    EKG: EKG Interpretation Date/Time:  Friday January 16 2024 19:37:28 EDT Ventricular Rate:  74 PR Interval:  254 QRS Duration:  77 QT Interval:  394 QTC Calculation: 438 R Axis:   65  Text Interpretation: Sinus rhythm Prolonged PR interval Borderline T abnormalities, inferior leads artifact limb leads limits interpretation Confirmed by Freddi Hamilton [phone]) on 01/16/2024 7:52:29 PM  Radiology: No results found.  {Document cardiac monitor, telemetry assessment procedure when appropriate:32947} Procedures   Medications Ordered in the ED  lactated  ringers  bolus 1,000 mL (has no administration in time range)      {Click here for ABCD2, HEART and other calculators REFRESH Note before signing:1}                              Medical Decision Making Amount and/or Complexity of Data Reviewed Labs: ordered. Radiology: ordered.   ***  {Document critical care time when appropriate  Document review of labs and clinical decision tools ie CHADS2VASC2, etc  Document your independent review of radiology images and any outside records  Document your discussion with family members, caretakers and with consultants  Document social determinants of health affecting pt's care  Document your  decision making why or why not admission, treatments were needed:32947:::1}   Final diagnoses:  None    ED Discharge Orders     None

## 2024-01-22 ENCOUNTER — Encounter (HOSPITAL_COMMUNITY): Payer: Self-pay | Admitting: Emergency Medicine

## 2024-01-22 ENCOUNTER — Emergency Department (HOSPITAL_COMMUNITY)
Admission: EM | Admit: 2024-01-22 | Discharge: 2024-01-22 | Disposition: A | Discharge status: Home / Self Care | Attending: Emergency Medicine | Admitting: Emergency Medicine

## 2024-01-22 ENCOUNTER — Telehealth: Payer: Self-pay

## 2024-01-22 DIAGNOSIS — Z8546 Personal history of malignant neoplasm of prostate: Secondary | ICD-10-CM | POA: Diagnosis not present

## 2024-01-22 DIAGNOSIS — R339 Retention of urine, unspecified: Secondary | ICD-10-CM | POA: Diagnosis present

## 2024-01-22 DIAGNOSIS — Z794 Long term (current) use of insulin: Secondary | ICD-10-CM | POA: Diagnosis not present

## 2024-01-22 MED ORDER — ONDANSETRON HCL 4 MG/2ML IJ SOLN
4.0000 mg | Freq: Once | INTRAMUSCULAR | Status: DC | PRN
  Filled 2024-01-22: qty 2

## 2024-01-22 MED ORDER — MORPHINE SULFATE (PF) 4 MG/ML IV SOLN
4.0000 mg | Freq: Once | INTRAVENOUS | Status: DC | PRN
  Filled 2024-01-22: qty 1

## 2024-01-22 NOTE — Telephone Encounter (Signed)
 I will forward to MD and cc Lauraine, for now please schedule with Sarah on 01/29/2022 130pm.

## 2024-01-22 NOTE — ED Provider Notes (Signed)
 [name] EMERGENCY DEPARTMENT AT [facility] Provider Note   CSN: [id number] Arrival date & time: 01/22/24  9251     Patient presents with: Urinary Retention   Seth Werner is a 68 y.o. male.   68 year old male with past medical history significant for prostate cancer currently in remission with history of urethral strictures presents today for concern of urinary retention. He states he has been having some issues over the past week with cathing.  He has been taking him to attempts.  He chronically caths himself every other day.  However last night he states he was unable to void and had to have his wife cath him 3 times.  He states since arriving to the emergency department he has been incontinent.  Denies any pain.  No dysuria.  No prior history of kidney stones.        Prior to Admission medications   Medication Sig Start Date End Date Taking? Authorizing Provider  amLODipine  (NORVASC ) 10 MG tablet Take 10 mg by mouth daily.     [provider]  atorvastatin  (LIPITOR) 40 MG tablet TAKE ONE TABLET BY MOUTH DAILY Patient not taking: Reported on 12/01/2023 11/22/15   Alvan Dorn FALCON, MD  Besifloxacin HCl (BESIVANCE) 0.6 % SUSP Administer 1 drop into the left eye. Once Every 7 weeks 03/27/18   [provider]  Blood Glucose Monitoring Suppl (ONE TOUCH ULTRA MINI) w/Device KIT USE TO CHECK SUGAR 09/04/21   Therisa Benton PARAS, NP  bumetanide  (BUMEX ) 0.5 MG tablet Take 0.5 tablets (0.25 mg total) by mouth 2 (two) times daily. 05/06/23   Miriam Norris, NP  carvedilol  (COREG ) 25 MG tablet Take 25 mg by mouth 2 (two) times daily with a meal.    [provider]  Dulaglutide  (TRULICITY ) 3 MG/0.5ML SOAJ Inject 3 mg as directed once a week. 12/22/23   Therisa Benton PARAS, NP  fluticasone (FLONASE) 50 MCG/ACT nasal spray     [provider]  glucose blood (ONETOUCH ULTRA) test strip USE TO CHECK BLOOD GLUCOSE TWO TIMES DAILY 06/08/20   Nida,  Gebreselassie W, MD  glucose blood (ONETOUCH VERIO) test strip USE TO CHECK BLOOD SUGAR TWICE DAILY BEFORE BREAKFAST AND BEFORE BEDTIME 12/22/23   Therisa Benton PARAS, NP  hydrALAZINE  (APRESOLINE ) 10 MG tablet Take 10 mg by mouth 2 (two) times daily. 10/09/23   [provider]  HYDROcodone -acetaminophen  (NORCO/VICODIN) 5-325 MG tablet Take 0.5 tablets by mouth every 6 (six) hours as needed for moderate pain.    [provider]  insulin  degludec (TRESIBA  FLEXTOUCH) 100 UNIT/ML FlexTouch Pen Inject 20 Units into the skin at bedtime. 12/22/23   Therisa Benton PARAS, NP  Insulin  Pen Needle (BD PEN NEEDLE NANO 2ND GEN) 32G X 4 MM MISC USE TO INJECT INSULIN  once daily 12/22/23   Therisa Benton PARAS, NP  KLOR-CON  20 MEQ packet TAKE 1 PACKET BY MOUTH TWICE DAILY 08/04/23   Alvan Dorn FALCON, MD  Lancets [facility] ULTRASOFT) lancets Use as instructed to check glucose twice daily 12/22/23   Therisa Benton PARAS, NP  loratadine (CLARITIN) 10 MG tablet Take 10 mg by mouth daily.    [provider]  losartan  (COZAAR ) 50 MG tablet Take 50 mg by mouth daily. 09/22/23   [provider]  Multiple Vitamins-Minerals (MULTIVITAMIN ADULT PO) Take 1 tablet by mouth daily.     [provider]  nitroGLYCERIN  (NITROSTAT ) 0.4 MG SL tablet Place 1 tablet (0.4 mg total) under the tongue every 5 (  five) minutes as needed for chest pain. 02/03/23   Miriam Norris, NP  omeprazole (PRILOSEC) 20 MG capsule Take 20 mg by mouth daily. 09/22/20   [provider]  sulfamethoxazole -trimethoprim  (BACTRIM ) 400-80 MG tablet Take 1 tablet by mouth 2 (two) times daily. 12/17/23   Larocco, Sarah C, FNP    Allergies: Patient has no known allergies.    Review of Systems  Constitutional:  Negative for chills and fever.  Gastrointestinal:  Negative for abdominal pain, nausea and vomiting.  Genitourinary:  Positive for difficulty urinating. Negative for dysuria and flank pain.  All other systems reviewed and  are negative.   Updated Vital Signs BP 133/61   Pulse 80   Temp 98.9 F (37.2 C)   Resp 15   SpO2 98%   Physical Exam Vitals and nursing note reviewed.  Constitutional:      General: He is not in acute distress.    Appearance: Normal appearance. He is not ill-appearing.  HENT:     Head: Normocephalic and atraumatic.     Nose: Nose normal.  Eyes:     Conjunctiva/sclera: Conjunctivae normal.  Cardiovascular:     Rate and Rhythm: Normal rate and regular rhythm.  Pulmonary:     Effort: Pulmonary effort is normal. No respiratory distress.  Abdominal:     General: There is no distension.     Palpations: Abdomen is soft.     Tenderness: There is no abdominal tenderness. There is no guarding.  Musculoskeletal:        General: No deformity. Normal range of motion.     Cervical back: Normal range of motion.  Skin:    Findings: No rash.  Neurological:     Mental Status: He is alert.     (all labs ordered are listed, but only abnormal results are displayed) Labs Reviewed - No data to display  EKG: None  Radiology: No results found.   BLADDER CATHETERIZATION  Date/Time: 01/22/2024 12:40 PM  Performed by: Hildegard Loge, PA-C Authorized by: Hildegard Loge, PA-C   Consent:    Consent obtained:  Verbal   Consent given by:  Patient   Risks, benefits, and alternatives were discussed: yes     Risks discussed:  Incomplete procedure, false passage, infection, urethral injury and pain   Alternatives discussed:  No treatment Universal protocol:    Procedure explained and questions answered to patient or proxy's satisfaction: yes     Relevant documents present and verified: yes     Patient identity confirmed:  Verbally with patient Pre-procedure details:    Procedure purpose:  Therapeutic Procedure details:    Provider performed due to:  Complicated insertion   Catheter insertion:  Indwelling   Catheter type:  Coude   Catheter size:  16 Fr   Bladder irrigation: no     Number of  attempts:  1 Post-procedure details:    Procedure completion:  Procedure terminated electively by provider Comments:     Procedure not successful due to patient's anatomy.  Urology consulted.    Medications Ordered in the ED  morphine  (PF) 4 MG/ML injection 4 mg (has no administration in time range)  ondansetron  (ZOFRAN ) injection 4 mg (has no administration in time range)                                    Medical Decision Making Risk Prescription drug management.   68 year old male presents today  for concern of urinary retention.  Has history of prostate cancer and urethral strictures.  I attempted Foley placement with a coud catheter without success likely due to his anatomy.  Urology consulted. Urology attempted catheterization but was unable to due to large stricture however they were able to dilate the urethra.  See their note for description.  They state patient is stable for discharge with close follow-up with urology. Patient is in agreement with plan. Discharged in stable condition.    Final diagnoses:  Urinary retention    ED Discharge Orders     None          Hildegard Loge, PA-C 01/22/24 1255    [name], [name], MD 01/23/24 [phone]

## 2024-01-22 NOTE — Telephone Encounter (Signed)
 Patient last seen in office by Dr. Watt on 05/29 at that time he was to continue CIC and follow up in 1 year.  Patient seen in hospital today.  Please see [facility] NP note.  Patient scheduled with Lauraine for next available on 07/10.  Please advise if patient needs to be seen sooner or be scheduled with MD.

## 2024-01-22 NOTE — Telephone Encounter (Signed)
 Patient scheduled with Dr. Sherrilee on 07/09-  I called and left a vm updating patient on appt change.
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
## 2024-01-22 NOTE — ED Triage Notes (Signed)
 Pt arriving POV for urinary retention. Pt reports his wife caths him daily but has recently been having issues getting the cath in. Pt reports there has been some blood noted in his urine recently. Pt states that he had some pressure in his lower abd area on the way here but when he got out of the car, some urine did leak out. Lower abd pressure has improved since. Hx of prostate cancer.

## 2024-01-22 NOTE — Procedures (Addendum)
  Procedure: flexible cysto bedside with urethral dilation   Urology Procedure Note: 68 year old male followed by Dr. Watt with history of postprocedural membranous urethral stricture.  Now in ED following obstruction of urine.  Please see separate consult note.  Urology was consulted after failing to place coud catheter in the emergency department.  After discussing another attempted catheter placement, possible need for stricture dilation with or without cystoscopy, patient provided consent.  He was prepped and draped in the usual sterile fashion.  Being aware of the stricture I started with a sensor wire which passed into the bladder without difficulty.  I attempted to pass 18 and 59f council tip catheters respectively which reached a hard stop in the proximal urethra.  I proceeded with serial dilation from 79f-62f Hayman's using Seldinger technique.  These passed smoothly and without excessive resistance.  I then again attempted to pass the previously mentioned council tip catheters with no improvement.  I then elected to proceed with cystoscopy to visualize the stricture.  It was located at the bottom of the stenosed area which would pass tapered instruments easily but blunt objects continued to stick.  I could not advance the cystoscope to the level of the stricture, though it was clearly visualized and wire was placed appropriately.  I passed one additional 28f Hayman dilator which again passed with minimal resistance over wire.  I again could not pass catheters or the cystoscope.  I attempted to fashion council tip catheters out of 14 and 16 French silicone catheters respectively as well as multiple sizes of coud's and nothing would pass.  The procedure was aborted at this time and patient was able to void.  Postvoid residual was around to 100 mL.  I think he will be able to void fine but will scarred down if he does not have formal dilation of the full length of his stricture.  Patient expressed  understanding and wishes to discharge home with plans to stop by the urology office to schedule OR dilation in the near future.   Ole Bourdon, NP [facility] Pager: [phone]

## 2024-01-22 NOTE — Telephone Encounter (Signed)
 Patient was seen at emergency dept due to retention.  ED could not get cath placed after urethra was dilated. Was advised to check with Dr. Sherrilee to see what will need to be done.  Please advise.  Call:  [phone]

## 2024-01-28 ENCOUNTER — Ambulatory Visit: Admitting: Urology

## 2024-01-28 ENCOUNTER — Encounter: Payer: Self-pay | Admitting: Urology

## 2024-01-28 VITALS — BP 125/69 | HR 79

## 2024-01-28 DIAGNOSIS — N401 Enlarged prostate with lower urinary tract symptoms: Secondary | ICD-10-CM

## 2024-01-28 DIAGNOSIS — N138 Other obstructive and reflux uropathy: Secondary | ICD-10-CM

## 2024-01-28 DIAGNOSIS — R339 Retention of urine, unspecified: Secondary | ICD-10-CM

## 2024-01-28 DIAGNOSIS — R338 Other retention of urine: Secondary | ICD-10-CM

## 2024-01-28 LAB — URINALYSIS, ROUTINE W REFLEX MICROSCOPIC
Bilirubin, UA: NEGATIVE
Glucose, UA: NEGATIVE
Ketones, UA: NEGATIVE
Leukocytes,UA: NEGATIVE
Nitrite, UA: NEGATIVE
RBC, UA: NEGATIVE
Specific Gravity, UA: 1.015 (ref 1.005–1.030)
Urobilinogen, Ur: 0.2 mg/dL (ref 0.2–1.0)
pH, UA: 6.5 (ref 5.0–7.5)

## 2024-01-28 LAB — MICROSCOPIC EXAMINATION: Bacteria, UA: NONE SEEN

## 2024-01-28 LAB — BLADDER SCAN AMB NON-IMAGING: Scan Result: 82

## 2024-01-28 MED ORDER — TAMSULOSIN HCL 0.4 MG PO CAPS: 0.4000 mg | ORAL_CAPSULE | Freq: Every day | ORAL | 11 refills | Status: DC

## 2024-01-28 NOTE — Progress Notes (Signed)
 Bladder Scan completed today.  Patient can void prior to the bladder scan. Bladder scan result: 82ml  Performed By: Veleria, CMA  Additional notes-  MD to see after

## 2024-01-28 NOTE — Patient Instructions (Signed)
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
## 2024-01-30 ENCOUNTER — Ambulatory Visit: Admitting: Urology

## 2024-02-06 ENCOUNTER — Inpatient Hospital Stay: Payer: PPO | Attending: Oncology

## 2024-02-06 ENCOUNTER — Ambulatory Visit (HOSPITAL_COMMUNITY)
Admission: RE | Admit: 2024-02-06 | Discharge: 2024-02-06 | Disposition: A | Discharge status: Home / Self Care | Source: Ambulatory Visit | Attending: Oncology | Admitting: Oncology

## 2024-02-06 DIAGNOSIS — N189 Chronic kidney disease, unspecified: Secondary | ICD-10-CM | POA: Diagnosis not present

## 2024-02-06 DIAGNOSIS — D472 Monoclonal gammopathy: Secondary | ICD-10-CM | POA: Insufficient documentation

## 2024-02-06 DIAGNOSIS — D631 Anemia in chronic kidney disease: Secondary | ICD-10-CM | POA: Diagnosis not present

## 2024-02-06 LAB — CBC WITH DIFFERENTIAL/PLATELET
Abs Immature Granulocytes: 0.01 K/uL (ref 0.00–0.07)
Basophils Absolute: 0 K/uL (ref 0.0–0.1)
Basophils Relative: 0 %
Eosinophils Absolute: 0.1 K/uL (ref 0.0–0.5)
Eosinophils Relative: 3 %
HCT: 35.7 % — ABNORMAL LOW (ref 39.0–52.0)
Hemoglobin: 11.7 g/dL — ABNORMAL LOW (ref 13.0–17.0)
Immature Granulocytes: 0 %
Lymphocytes Relative: 41 %
Lymphs Abs: 1.6 K/uL (ref 0.7–4.0)
MCH: 30.2 pg (ref 26.0–34.0)
MCHC: 32.8 g/dL (ref 30.0–36.0)
MCV: 92.2 fL (ref 80.0–100.0)
Monocytes Absolute: 0.3 K/uL (ref 0.1–1.0)
Monocytes Relative: 8 %
Neutro Abs: 1.9 K/uL (ref 1.7–7.7)
Neutrophils Relative %: 48 %
Platelets: 191 K/uL (ref 150–400)
RBC: 3.87 MIL/uL — ABNORMAL LOW (ref 4.22–5.81)
RDW: 14.7 % (ref 11.5–15.5)
WBC: 4 K/uL (ref 4.0–10.5)
nRBC: 0 % (ref 0.0–0.2)

## 2024-02-06 LAB — COMPREHENSIVE METABOLIC PANEL WITH GFR
ALT: 15 U/L (ref 0–44)
AST: 21 U/L (ref 15–41)
Albumin: 3.5 g/dL (ref 3.5–5.0)
Alkaline Phosphatase: 72 U/L (ref 38–126)
Anion gap: 12 (ref 5–15)
BUN: 20 mg/dL (ref 8–23)
CO2: 22 mmol/L (ref 22–32)
Calcium: 8.7 mg/dL — ABNORMAL LOW (ref 8.9–10.3)
Chloride: 105 mmol/L (ref 98–111)
Creatinine, Ser: 2.31 mg/dL — ABNORMAL HIGH (ref 0.61–1.24)
GFR, Estimated: 30 mL/min — ABNORMAL LOW (ref >60)
Glucose, Bld: 114 mg/dL — ABNORMAL HIGH (ref 70–99)
Potassium: 4.2 mmol/L (ref 3.5–5.1)
Sodium: 139 mmol/L (ref 135–145)
Total Bilirubin: 0.6 mg/dL (ref 0.0–1.2)
Total Protein: 6.8 g/dL (ref 6.5–8.1)

## 2024-02-06 LAB — LACTATE DEHYDROGENASE: LDH: 139 U/L (ref 98–192)

## 2024-02-09 ENCOUNTER — Other Ambulatory Visit (HOSPITAL_COMMUNITY): Payer: Self-pay

## 2024-02-09 DIAGNOSIS — D472 Monoclonal gammopathy: Secondary | ICD-10-CM | POA: Diagnosis not present

## 2024-02-09 LAB — KAPPA/LAMBDA LIGHT CHAINS
Kappa free light chain: 55.5 mg/L — ABNORMAL HIGH (ref 3.3–19.4)
Kappa, lambda light chain ratio: 1.72 — ABNORMAL HIGH (ref 0.26–1.65)
Lambda free light chains: 32.2 mg/L — ABNORMAL HIGH (ref 5.7–26.3)

## 2024-02-09 LAB — PROTEIN ELECTROPHORESIS, SERUM
A/G Ratio: 1.3 (ref 0.7–1.7)
Albumin ELP: 3.6 g/dL (ref 2.9–4.4)
Alpha-1-Globulin: 0.2 g/dL (ref 0.0–0.4)
Alpha-2-Globulin: 0.6 g/dL (ref 0.4–1.0)
Beta Globulin: 1 g/dL (ref 0.7–1.3)
Gamma Globulin: 1 g/dL (ref 0.4–1.8)
Globulin, Total: 2.7 g/dL (ref 2.2–3.9)
Total Protein ELP: 6.3 g/dL (ref 6.0–8.5)

## 2024-02-10 LAB — IMMUNOFIXATION ELECTROPHORESIS
IgA: 166 mg/dL (ref 61–437)
IgG (Immunoglobin G), Serum: 1153 mg/dL (ref 603–1613)
IgM (Immunoglobulin M), Srm: 7 mg/dL — ABNORMAL LOW (ref 20–172)
Total Protein ELP: 6.2 g/dL (ref 6.0–8.5)

## 2024-02-11 LAB — UPEP/UIFE/LIGHT CHAINS/TP, 24-HR UR
% BETA, Urine: 10.8 %
ALPHA 1 URINE: 4.2 %
Albumin, U: 72.5 %
Alpha 2, Urine: 4.3 %
Free Kappa Lt Chains,Ur: 50.32 mg/L (ref 1.17–86.46)
Free Kappa/Lambda Ratio: 6.58 (ref 1.83–14.26)
Free Lambda Lt Chains,Ur: 7.65 mg/L (ref 0.27–15.21)
GAMMA GLOBULIN URINE: 8.1 %
Total Protein, Urine-Ur/day: 933 mg/(24.h) — ABNORMAL HIGH (ref 30–150)
Total Protein, Urine: 49.1 mg/dL
Total Volume: 1900

## 2024-02-13 ENCOUNTER — Inpatient Hospital Stay: Payer: PPO | Admitting: Oncology

## 2024-02-13 VITALS — BP 127/65 | HR 77 | Temp 98.0°F | Resp 16 | Wt 265.4 lb

## 2024-02-13 DIAGNOSIS — D631 Anemia in chronic kidney disease: Secondary | ICD-10-CM | POA: Diagnosis not present

## 2024-02-13 DIAGNOSIS — N189 Chronic kidney disease, unspecified: Secondary | ICD-10-CM | POA: Diagnosis not present

## 2024-02-13 DIAGNOSIS — D472 Monoclonal gammopathy: Secondary | ICD-10-CM | POA: Diagnosis not present

## 2024-02-13 NOTE — Assessment & Plan Note (Addendum)
-   Reviewed labs from 02/06/2024.  Stable anemia. -Previous nutritional labs were within normal limits.  -Anemia likely secondary to CKD.

## 2024-02-13 NOTE — Assessment & Plan Note (Addendum)
-   Reviewed serum protein electrophoresis revealed stable IgG monoclonal protein with kappa light chain specificity. -Reviewed UPEP which revealed total protein 933 which is elevated immunofixation shows IgG monoclonal protein with kappa light chain specificity. -Patient has chronic kidney disease and normocytic anemia due to his kidneys otherwise no crab criteria.  Hemoglobin and creatinine stable.  No evidence of endorgan damage. -Most recent skeletal survey is from 02/12/2024 which did not reveal any acute or destructive bony abnormalities.  - Follow-up in 6 months with labs and see NP. -Repeat bone scan and 24-hour urine annually.

## 2024-02-13 NOTE — Progress Notes (Signed)
 [facility] OFFICE PROGRESS NOTE  [place], [facility]  ASSESSMENT & PLAN:  Assessment & Plan Anemia in chronic kidney disease, unspecified CKD stage - Reviewed labs from 02/06/2024.  Stable anemia. -Previous nutritional labs were within normal limits.  -Anemia likely secondary to CKD.    Monoclonal gammopathy of unknown significance (MGUS) - Reviewed serum protein electrophoresis revealed stable IgG monoclonal protein with kappa light chain specificity. -Reviewed UPEP which revealed total protein 933 which is elevated immunofixation shows IgG monoclonal protein with kappa light chain specificity. -Patient has chronic kidney disease and normocytic anemia due to his kidneys otherwise no crab criteria.  Hemoglobin and creatinine stable.  No evidence of endorgan damage. -Most recent skeletal survey is from 02/12/2024 which did not reveal any acute or destructive bony abnormalities.  - Follow-up in 6 months with labs and see NP. -Repeat bone scan and 24-hour urine annually.    Orders Placed This Encounter  Procedures   Protein electrophoresis, serum    Standing Status:   Future    Expected Date:   08/06/2024    Expiration Date:   02/12/2025   CBC with Differential    Standing Status:   Future    Expected Date:   08/06/2024    Expiration Date:   02/12/2025   Comprehensive metabolic panel    Standing Status:   Future    Expected Date:   08/06/2024    Expiration Date:   02/12/2025   Lactate dehydrogenase    Standing Status:   Future    Expected Date:   08/06/2024    Expiration Date:   02/12/2025   Kappa/lambda light chains    Standing Status:   Future    Expected Date:   08/06/2024    Expiration Date:   02/12/2025   Immunofixation electrophoresis    Standing Status:   Future    Expected Date:   08/06/2024    Expiration Date:   02/12/2025    INTERVAL HISTORY: Patient returns for MGUS and anemia in the setting of CKD.  We reviewed MGUS labs, 24-hour urine, CBC, CMP,  LDH and bone scan.  In the interim, he was seen at was in the hospital on 01/22/2024 for urinary retention d/t urethral stricture requiring urethral dilation.  He is currently on Flomax .  States he kept getting infections from self cathing.   He was seen for lightheadedness on 01/16/2024 thought to be due to dehydration and heat.  Symptoms resolved prior to discharge.  Reports overall, he has been doing well.  Appetite and energy levels are 100%.  Denies any pain.  Reports since he was started on Flomax  by urology, he is able to pee without self cathing.  He will self cath once a week.  No additional UTIs since starting Flomax .  SUMMARY OF HEMATOLOGIC HISTORY: Patient is a 68 year old male who is followed by hematology/oncology for anemia secondary to CKD and MGUS.  He was initially sent over to us  by his nephrologist Dr. Rachele for abnormal immunofixation in his urine.  He has since been followed by us .  No crab criteria at this time.  No results found for: CBC  Vitals:   02/13/24 0859  BP: 127/65  Pulse: 77  Resp: 16  Temp: 98 F (36.7 C)  SpO2: 100%    Review of Systems  Genitourinary:  Positive for dysuria (at times/self caths weekly).  Neurological:  Positive for dizziness and sensory change.    Physical Exam Constitutional:  Appearance: Normal appearance.  Cardiovascular:     Rate and Rhythm: Normal rate and regular rhythm.  Pulmonary:     Effort: Pulmonary effort is normal.     Breath sounds: Normal breath sounds.  Abdominal:     General: Bowel sounds are normal.     Palpations: Abdomen is soft.  Musculoskeletal:        General: No swelling. Normal range of motion.  Neurological:     Mental Status: He is alert and oriented to person, place, and time. Mental status is at baseline.      I spent 25 minutes dedicated to the care of this patient (face-to-face and non-face-to-face) on the date of the encounter to include what is described in the assessment and  plan.,  Seth Hope, NP 02/13/2024 9:21 AM

## 2024-02-19 ENCOUNTER — Telehealth: Payer: Self-pay | Admitting: Urology

## 2024-02-19 MED ORDER — TAMSULOSIN HCL 0.4 MG PO CAPS: 0.4000 mg | ORAL_CAPSULE | Freq: Two times a day (BID) | ORAL | 11 refills | Status: AC

## 2024-02-19 NOTE — Telephone Encounter (Signed)
 States his medication Tamsulosin  is not working..he woke up last night and could not go. Wife had to use cathter and that was hard to do too.

## 2024-02-19 NOTE — Telephone Encounter (Signed)
 Patient called and made aware per MD to increase Flomax  to bid. Patient voiced understanding.

## 2024-02-19 NOTE — Telephone Encounter (Signed)
 Patient states he woke up in the middle of the night unable to void. Wife has had to catheterize patient twice since last night. Patient states he has been able to void on his on since lunch today. Patient is requesting an increase in his medication or a stronger medication.

## 2024-05-10 ENCOUNTER — Other Ambulatory Visit: Payer: Self-pay | Admitting: Nurse Practitioner

## 2024-06-10 ENCOUNTER — Emergency Department (HOSPITAL_COMMUNITY)
Admission: EM | Admit: 2024-06-10 | Discharge: 2024-06-10 | Disposition: A | Discharge status: Home / Self Care | Attending: Emergency Medicine | Admitting: Emergency Medicine

## 2024-06-10 ENCOUNTER — Other Ambulatory Visit: Payer: Self-pay

## 2024-06-10 ENCOUNTER — Encounter (HOSPITAL_COMMUNITY): Payer: Self-pay

## 2024-06-10 DIAGNOSIS — R300 Dysuria: Secondary | ICD-10-CM | POA: Diagnosis not present

## 2024-06-10 DIAGNOSIS — Z8546 Personal history of malignant neoplasm of prostate: Secondary | ICD-10-CM | POA: Diagnosis not present

## 2024-06-10 DIAGNOSIS — N35919 Unspecified urethral stricture, male, unspecified site: Secondary | ICD-10-CM

## 2024-06-10 DIAGNOSIS — Z79899 Other long term (current) drug therapy: Secondary | ICD-10-CM | POA: Diagnosis not present

## 2024-06-10 DIAGNOSIS — Z794 Long term (current) use of insulin: Secondary | ICD-10-CM | POA: Diagnosis not present

## 2024-06-10 DIAGNOSIS — R339 Retention of urine, unspecified: Secondary | ICD-10-CM | POA: Diagnosis present

## 2024-06-10 LAB — URINALYSIS, ROUTINE W REFLEX MICROSCOPIC
Bilirubin Urine: NEGATIVE
Glucose, UA: NEGATIVE mg/dL
Ketones, ur: NEGATIVE mg/dL
Nitrite: NEGATIVE
Protein, ur: 100 mg/dL — AB
Specific Gravity, Urine: 1.01 (ref 1.005–1.030)
pH: 6 (ref 5.0–8.0)

## 2024-06-10 MED ORDER — HYDROMORPHONE HCL 1 MG/ML IJ SOLN
0.5000 mg | Freq: Once | INTRAMUSCULAR | Status: AC
  Administered 2024-06-10: 0.5 mg via INTRAMUSCULAR
  Filled 2024-06-10: qty 1

## 2024-06-10 MED ORDER — HYDROMORPHONE HCL 1 MG/ML IJ SOLN
1.0000 mg | Freq: Once | INTRAMUSCULAR | Status: DC
Start: 2024-06-10 — End: 2024-06-10

## 2024-06-10 MED ORDER — HYDROMORPHONE HCL 1 MG/ML IJ SOLN: 0.5000 mg | Freq: Once | INTRAMUSCULAR | Status: DC

## 2024-06-10 NOTE — ED Notes (Signed)
 Attempted to insert 16 and 14 urinary catheter for patient. Not successful each time. EDP at bedside to assist with failure to insert as well. EDP going to speak with urology.

## 2024-06-10 NOTE — ED Triage Notes (Signed)
 Pov from home. Cc of urinary retention.  Was told to stop self-cathing in May and placed on flomax  by Dr Almetta. Yesterday he was having trouble voiding. His wife cathed him twice but it hurt too bad. Tonight he still couldn't go so he checked in.

## 2024-06-10 NOTE — ED Notes (Signed)
 Pt was able to produce 30ml of urine for urine sample

## 2024-06-10 NOTE — ED Provider Notes (Signed)
 Patient seen by urology Dr. Shane.  Catheter has been placed and outpatient is feeling improved.  He will need to be monitored since he was given narcotics, then will be safe for discharge   Midge Golas, MD 06/10/24 [phone]

## 2024-06-10 NOTE — ED Notes (Signed)
 Patient alert and oriented - states he feels ready to go home. Patient encouraged to get dressed. MD messaged to notify.

## 2024-06-10 NOTE — ED Provider Notes (Signed)
 [place] EMERGENCY DEPARTMENT AT [facility] Provider Note   CSN: [id number] Arrival date & time: 06/10/24  0036     Patient presents with: Urinary Retention   Seth Werner is a 68 y.o. male.   Patient is a 68 year old male with history of prostate cancer and urethral stricture.  Patient presenting with urinary retention.  He reports having difficulty urinating the past 2 days.  His wife performed catheterization on him twice this evening and was able to obtain urine, however he experienced significant discomfort with his last catheterization.  He woke up with the feeling he needed to urinate, but only went in small quantities.  He is worried about retention.  No fevers or chills.       Prior to Admission medications   Medication Sig Start Date End Date Taking? Authorizing Provider  amLODipine  (NORVASC ) 10 MG tablet Take 10 mg by mouth daily.     [provider]  atorvastatin  (LIPITOR) 40 MG tablet TAKE ONE TABLET BY MOUTH DAILY 11/22/15   Alvan Dorn FALCON, MD  Besifloxacin HCl (BESIVANCE) 0.6 % SUSP Administer 1 drop into the left eye. Once Every 7 weeks 03/27/18   [provider]  Blood Glucose Monitoring Suppl (ONE TOUCH ULTRA MINI) w/Device KIT USE TO CHECK SUGAR 09/04/21   Therisa Benton PARAS, NP  bumetanide  (BUMEX ) 0.5 MG tablet Take 0.5 tablets (0.25 mg total) by mouth 2 (two) times daily. 05/06/23   Miriam Norris, NP  carvedilol  (COREG ) 25 MG tablet Take 25 mg by mouth 2 (two) times daily with a meal.    [provider]  Dulaglutide  (TRULICITY ) 3 MG/0.5ML SOAJ Inject 3 mg as directed once a week. 12/22/23   Therisa Benton PARAS, NP  fluticasone (FLONASE) 50 MCG/ACT nasal spray     [provider]  glucose blood (ONETOUCH ULTRA) test strip USE TO CHECK BLOOD GLUCOSE TWO TIMES DAILY 06/08/20   Nida, Gebreselassie W, MD  glucose blood (ONETOUCH VERIO) test strip USE TO CHECK BLOOD SUGAR TWICE DAILY BEFORE BREAKFAST AND BEFORE BEDTIME  12/22/23   Therisa Benton PARAS, NP  hydrALAZINE  (APRESOLINE ) 10 MG tablet Take 10 mg by mouth 2 (two) times daily. 10/09/23   [provider]  HYDROcodone -acetaminophen  (NORCO/VICODIN) 5-325 MG tablet Take 0.5 tablets by mouth every 6 (six) hours as needed for moderate pain.    [provider]  insulin  degludec (TRESIBA  FLEXTOUCH) 100 UNIT/ML FlexTouch Pen Inject 20 Units into the skin at bedtime. 12/22/23   Therisa Benton PARAS, NP  Insulin  Pen Needle (BD PEN NEEDLE NANO 2ND GEN) 32G X 4 MM MISC USE TO INJECT INSULIN  once daily 12/22/23   Therisa Benton PARAS, NP  KLOR-CON  20 MEQ packet TAKE 1 PACKET BY MOUTH TWICE DAILY 08/04/23   Alvan Dorn FALCON, MD  Lancets [facility] ULTRASOFT) lancets Use as instructed to check glucose twice daily 12/22/23   Therisa Benton PARAS, NP  loratadine (CLARITIN) 10 MG tablet Take 10 mg by mouth daily.    [provider]  losartan  (COZAAR ) 50 MG tablet Take 50 mg by mouth daily. 09/22/23   [provider]  Multiple Vitamins-Minerals (MULTIVITAMIN ADULT PO) Take 1 tablet by mouth daily.     [provider]  nitroGLYCERIN  (NITROSTAT ) 0.4 MG SL tablet Place 1 tablet (0.4 mg total) under the tongue every 5 (five) minutes as needed for chest pain. 02/03/23   Miriam Norris, NP  omeprazole (PRILOSEC) 20 MG capsule Take 20 mg by mouth daily. 09/22/20  [provider]  sulfamethoxazole -trimethoprim  (BACTRIM ) 400-80 MG tablet Take 1 tablet by mouth 2 (two) times daily. 12/17/23   Gerldine Lauraine BROCKS, FNP  tamsulosin  (FLOMAX ) 0.4 MG CAPS capsule Take 1 capsule (0.4 mg total) by mouth 2 (two) times daily. 02/19/24   McKenzie, Belvie CROME, MD    Allergies: Patient has no known allergies.    Review of Systems  All other systems reviewed and are negative.   Updated Vital Signs BP (!) 162/74   Pulse 70   Resp 18   Ht 5' 5 (1.651 m)   Wt 121 kg   SpO2 95%   BMI 44.39 kg/m   Physical Exam Vitals and nursing note reviewed.  Constitutional:       General: He is not in acute distress.    Appearance: He is well-developed. He is not diaphoretic.  HENT:     Head: Normocephalic and atraumatic.  Cardiovascular:     Rate and Rhythm: Normal rate and regular rhythm.     Heart sounds: No murmur heard.    No friction rub.  Pulmonary:     Effort: Pulmonary effort is normal. No respiratory distress.     Breath sounds: Normal breath sounds. No wheezing or rales.  Abdominal:     General: Bowel sounds are normal. There is no distension.     Palpations: Abdomen is soft.     Tenderness: There is no abdominal tenderness.  Musculoskeletal:        General: Normal range of motion.     Cervical back: Normal range of motion and neck supple.  Skin:    General: Skin is warm and dry.  Neurological:     Mental Status: He is alert and oriented to person, place, and time.     Coordination: Coordination normal.     (all labs ordered are listed, but only abnormal results are displayed) Labs Reviewed - No data to display  EKG: None  Radiology: No results found.   Procedures   Medications Ordered in the ED - No data to display                                  Medical Decision Making Amount and/or Complexity of Data Reviewed Labs: ordered.   Patient is a 68 year old male with history of urethral stricture presenting with urinary retention.  He has been having difficulty voiding for the past 2 days.  His wife had been able to obtain urine this evening, however is now unable to pass the catheter.  Patient does feel as though he needs to void and is only able to pass small amounts with hard Valsalva.  Patient arrives here with stable vital signs and is afebrile.  Plan was to place a urinary catheter, however attempts with a 14 and 16 French coud catheter were unsuccessful.  I have discussed the situation with Dr. Shane from urology.  He has recommended to send the patient to [facility] for dilatation of the stricture.  I have spoken  with Dr. Midge in the ER at [facility] who agrees to accept in transfer.     Final diagnoses:  None    ED Discharge Orders     None          Geroldine Berg, MD 06/10/24 [phone]

## 2024-06-10 NOTE — ED Provider Notes (Signed)
 Patient arrives from [facility] to see urology for acute urinary obstruction/retention and unable to pass a Foley Discussed the case with Dr. Shane who will come to see the patient   Midge Golas, MD 06/10/24 [phone]

## 2024-06-10 NOTE — ED Provider Notes (Signed)
 Received patient in turnover from Dr. Midge.  Please see their note for further details of Hx, PE.  Briefly patient is a 68 y.o. male with a Urinary Retention .  Patient given narcotic pain management.  Plan to reassess and see if he is safe to drive home.  Patient reassessed.  Feeling good.  Up ambulating will discharge home.  urology follow-up.    Emil Share, DO 06/10/24 [phone]
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
## 2024-06-10 NOTE — ED Notes (Signed)
 Dr Shane paged to Dr Geroldine

## 2024-06-10 NOTE — Procedures (Signed)
 Procedure: Cystoscopy urethral dilation using filaforms and balloon   Indication: Urinary retention secondary to membranous stricture secondary to radiation  Findings: Very dense membranous urethral stricture that would not dilate with [place] sounds required balloon dilation.  Drains: 16 French council catheter 10 cc in the balloon  Procedure: Wire was placed into the bladder after the patient was prepped and draped in usual sterile fashion patient using Jolaine sounds was dilated to 20 French due to some blood there was concerned that proper wire placement need to be confirmed so cystoscopy was used to confirm proper wire placement which it was after dilating 24 French a 16 French council catheter Would not go it was likely because the [place] sounds were not reaching the membranous urethra to dilate it.  So a UroMax balloon was used to dilate the urethra to 24 French at a pressure of 20 ATM.  Once this was done the cystoscope confirmed proper placement the bladder a 16 French council catheter was then placed and light pink urine returned.  Procedure: flexible cysto bedside Procedure: insert dilation FILA/FOLL Procedure: insert indwelling catheter bedside

## 2024-06-10 NOTE — Discharge Instructions (Addendum)
Follow-up with your urologist in the office

## 2024-06-16 ENCOUNTER — Ambulatory Visit (INDEPENDENT_AMBULATORY_CARE_PROVIDER_SITE_OTHER)

## 2024-06-16 DIAGNOSIS — R339 Retention of urine, unspecified: Secondary | ICD-10-CM | POA: Diagnosis not present

## 2024-06-16 NOTE — Progress Notes (Addendum)
 Pt came in today for cath bag change due to cath bag came apart from the catheter. Cath bag was change and anchor placed. [facility] Werner

## 2024-06-22 ENCOUNTER — Encounter: Payer: Self-pay | Admitting: Nurse Practitioner

## 2024-06-22 ENCOUNTER — Ambulatory Visit: Admitting: Nurse Practitioner

## 2024-06-22 VITALS — BP 112/62 | HR 99 | Ht 65.0 in | Wt 267.4 lb

## 2024-06-22 DIAGNOSIS — Z794 Long term (current) use of insulin: Secondary | ICD-10-CM | POA: Diagnosis not present

## 2024-06-22 DIAGNOSIS — E559 Vitamin D deficiency, unspecified: Secondary | ICD-10-CM

## 2024-06-22 DIAGNOSIS — I10 Essential (primary) hypertension: Secondary | ICD-10-CM

## 2024-06-22 DIAGNOSIS — Z7984 Long term (current) use of oral hypoglycemic drugs: Secondary | ICD-10-CM

## 2024-06-22 DIAGNOSIS — E782 Mixed hyperlipidemia: Secondary | ICD-10-CM

## 2024-06-22 DIAGNOSIS — E1122 Type 2 diabetes mellitus with diabetic chronic kidney disease: Secondary | ICD-10-CM

## 2024-06-22 DIAGNOSIS — N183 Chronic kidney disease, stage 3 unspecified: Secondary | ICD-10-CM

## 2024-06-22 DIAGNOSIS — Z7985 Long-term (current) use of injectable non-insulin antidiabetic drugs: Secondary | ICD-10-CM

## 2024-06-22 MED ORDER — TRESIBA FLEXTOUCH 100 UNIT/ML ~~LOC~~ SOPN: 20.0000 [IU] | PEN_INJECTOR | Freq: Every day | SUBCUTANEOUS | 1 refills | Status: AC

## 2024-06-22 MED ORDER — TRULICITY 4.5 MG/0.5ML ~~LOC~~ SOAJ: 4.5000 mg | SUBCUTANEOUS | 1 refills | Status: AC

## 2024-06-22 MED ORDER — ONETOUCH VERIO VI STRP: ORAL_STRIP | 2 refills | Status: AC
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
## 2024-06-23 ENCOUNTER — Ambulatory Visit

## 2024-06-23 DIAGNOSIS — R339 Retention of urine, unspecified: Secondary | ICD-10-CM

## 2024-06-23 LAB — BLADDER SCAN AMB NON-IMAGING: Scan Result: 12

## 2024-06-23 MED ORDER — CIPROFLOXACIN HCL 500 MG PO TABS
500.0000 mg | ORAL_TABLET | Freq: Once | ORAL | Status: AC
  Administered 2024-06-23: 500 mg via ORAL

## 2024-06-23 NOTE — Progress Notes (Signed)
 Fill and Pull Catheter Removal  Patient is present today for a catheter removal due to urinary retention.  200 ml of sterile water  was instilled into the bladder when the patient had a bladder spasm. 10ml of water  was then drained from the balloon.  A 16 FR foley cath was removed from the bladder no complications were noted .  Foley catheter intact and time of removal. Patient advised to return no later than 2 PM for PVR.  Patient tolerated well.  One oral prophylactic antibiotic given per MD orders  Performed by: Exie T. CMA  Follow up/ Additional notes: will return by 2PM for PVR pt voiced his understanding   Bladder Scan completed today due to reason of urinary retention  Patient can void prior to the bladder scan. Bladder scan result: 12  Performed By: Exie DASEN. CMA  Additional notes- Patient is scheduled to follow up with as scheduled

## 2024-06-28 ENCOUNTER — Ambulatory Visit

## 2024-08-02 ENCOUNTER — Other Ambulatory Visit: Payer: Self-pay | Admitting: Nurse Practitioner

## 2024-08-13 ENCOUNTER — Inpatient Hospital Stay

## 2024-08-13 ENCOUNTER — Inpatient Hospital Stay: Attending: Oncology

## 2024-08-13 DIAGNOSIS — N183 Chronic kidney disease, stage 3 unspecified: Secondary | ICD-10-CM | POA: Insufficient documentation

## 2024-08-13 DIAGNOSIS — D472 Monoclonal gammopathy: Secondary | ICD-10-CM | POA: Insufficient documentation

## 2024-08-13 DIAGNOSIS — D631 Anemia in chronic kidney disease: Secondary | ICD-10-CM | POA: Insufficient documentation

## 2024-08-13 LAB — CBC WITH DIFFERENTIAL/PLATELET
Abs Immature Granulocytes: 0.01 K/uL (ref 0.00–0.07)
Basophils Absolute: 0 K/uL (ref 0.0–0.1)
Basophils Relative: 0 %
Eosinophils Absolute: 0.2 K/uL (ref 0.0–0.5)
Eosinophils Relative: 4 %
HCT: 37 % — ABNORMAL LOW (ref 39.0–52.0)
Hemoglobin: 12.1 g/dL — ABNORMAL LOW (ref 13.0–17.0)
Immature Granulocytes: 0 %
Lymphocytes Relative: 34 %
Lymphs Abs: 1.6 K/uL (ref 0.7–4.0)
MCH: 30.6 pg (ref 26.0–34.0)
MCHC: 32.7 g/dL (ref 30.0–36.0)
MCV: 93.7 fL (ref 80.0–100.0)
Monocytes Absolute: 0.4 K/uL (ref 0.1–1.0)
Monocytes Relative: 8 %
Neutro Abs: 2.6 K/uL (ref 1.7–7.7)
Neutrophils Relative %: 54 %
Platelets: 191 K/uL (ref 150–400)
RBC: 3.95 MIL/uL — ABNORMAL LOW (ref 4.22–5.81)
RDW: 14 % (ref 11.5–15.5)
WBC: 4.8 K/uL (ref 4.0–10.5)
nRBC: 0 % (ref 0.0–0.2)

## 2024-08-13 LAB — COMPREHENSIVE METABOLIC PANEL WITH GFR
ALT: 21 U/L (ref 0–44)
AST: 24 U/L (ref 15–41)
Albumin: 4 g/dL (ref 3.5–5.0)
Alkaline Phosphatase: 79 U/L (ref 38–126)
Anion gap: 13 (ref 5–15)
BUN: 22 mg/dL (ref 8–23)
CO2: 23 mmol/L (ref 22–32)
Calcium: 8.9 mg/dL (ref 8.9–10.3)
Chloride: 105 mmol/L (ref 98–111)
Creatinine, Ser: 2.37 mg/dL — ABNORMAL HIGH (ref 0.61–1.24)
GFR, Estimated: 29 mL/min — ABNORMAL LOW
Glucose, Bld: 114 mg/dL — ABNORMAL HIGH (ref 70–99)
Potassium: 4.9 mmol/L (ref 3.5–5.1)
Sodium: 141 mmol/L (ref 135–145)
Total Bilirubin: 0.4 mg/dL (ref 0.0–1.2)
Total Protein: 6.7 g/dL (ref 6.5–8.1)

## 2024-08-13 LAB — LACTATE DEHYDROGENASE: LDH: 193 U/L (ref 105–235)

## 2024-08-17 LAB — KAPPA/LAMBDA LIGHT CHAINS
Kappa free light chain: 56.9 mg/L — ABNORMAL HIGH (ref 3.3–19.4)
Kappa, lambda light chain ratio: 1.77 — ABNORMAL HIGH (ref 0.26–1.65)
Lambda free light chains: 32.2 mg/L — ABNORMAL HIGH (ref 5.7–26.3)

## 2024-08-17 LAB — PROTEIN ELECTROPHORESIS, SERUM
A/G Ratio: 1.2 (ref 0.7–1.7)
Albumin ELP: 3.4 g/dL (ref 2.9–4.4)
Alpha-1-Globulin: 0.2 g/dL (ref 0.0–0.4)
Alpha-2-Globulin: 0.7 g/dL (ref 0.4–1.0)
Beta Globulin: 1 g/dL (ref 0.7–1.3)
Gamma Globulin: 0.8 g/dL (ref 0.4–1.8)
Globulin, Total: 2.8 g/dL (ref 2.2–3.9)
Total Protein ELP: 6.2 g/dL (ref 6.0–8.5)

## 2024-08-18 LAB — IMMUNOFIXATION ELECTROPHORESIS
IgA: 182 mg/dL (ref 61–437)
IgG (Immunoglobin G), Serum: 1143 mg/dL (ref 603–1613)
IgM (Immunoglobulin M), Srm: 6 mg/dL — ABNORMAL LOW (ref 20–172)
Total Protein ELP: 6.3 g/dL (ref 6.0–8.5)

## 2024-08-20 ENCOUNTER — Ambulatory Visit: Admitting: Oncology

## 2024-08-20 ENCOUNTER — Inpatient Hospital Stay

## 2024-08-24 ENCOUNTER — Inpatient Hospital Stay: Admitting: Oncology

## 2024-08-24 VITALS — BP 137/63 | HR 76 | Temp 97.9°F | Resp 18 | Ht 65.0 in | Wt 167.0 lb

## 2024-08-24 DIAGNOSIS — N189 Chronic kidney disease, unspecified: Secondary | ICD-10-CM

## 2024-08-24 DIAGNOSIS — D472 Monoclonal gammopathy: Secondary | ICD-10-CM

## 2024-08-24 DIAGNOSIS — D631 Anemia in chronic kidney disease: Secondary | ICD-10-CM | POA: Diagnosis not present

## 2024-08-24 NOTE — Progress Notes (Signed)
 "  [facility] OFFICE PROGRESS NOTE  [place], [facility]  ASSESSMENT & PLAN:  Assessment & Plan Monoclonal gammopathy of unknown significance (MGUS) - Reviewed serum protein electrophoresis revealed stable IgG monoclonal protein with kappa light chain specificity.  No M spike. -Reviewed UPEP (02/09/2024) which revealed total protein 933 which is elevated immunofixation shows IgG monoclonal protein with kappa light chain specificity. -Patient has chronic kidney disease and normocytic anemia due to his kidneys otherwise no crab criteria.  Hemoglobin and creatinine stable.  No evidence of endorgan damage. -Most recent skeletal survey is from 02/12/2024 which did not reveal any acute or destructive bony abnormalities.  - Follow-up in 6 months with labs and see NP. - Repeat scan and 24-hour urine as needed based on MGUS labs. Anemia in chronic kidney disease, unspecified CKD stage - Reviewed labs from 08/13/24 which showed creatinine 2.37 and GFR 29. -Hemoglobin has actually improved some from previous. -Previous nutritional labs were within normal limits.  -Anemia likely secondary to CKD.     Orders Placed This Encounter  Procedures   Protein electrophoresis, serum    Standing Status:   Future    Expected Date:   02/15/2025    Expiration Date:   08/24/2025   CBC with Differential    Standing Status:   Future    Expected Date:   02/15/2025    Expiration Date:   08/24/2025   Comprehensive metabolic panel    Standing Status:   Future    Expected Date:   02/15/2025    Expiration Date:   08/24/2025   Lactate dehydrogenase    Standing Status:   Future    Expected Date:   02/15/2025    Expiration Date:   08/24/2025   Kappa/lambda light chains    Standing Status:   Future    Expected Date:   02/15/2025    Expiration Date:   08/24/2025   Immunofixation electrophoresis    Standing Status:   Future    Expected Date:   02/15/2025    Expiration Date:   08/24/2025   INTERVAL  HISTORY: Patient returns for MGUS and anemia in the setting of CKD.  We reviewed MGUS labs, CBC, CMP, LDH.  Since last visit, patient was seen in the emergency department for headache and sinusitis on 05/26/2024.  He was evaluated on 06/10/2024 for urinary retention requiring catheter and urology for placement due to possible stricture later found to be due to enlarged prostate.  He follows with Dr. Sherrilee and will see him back in a few months.  He did require a Foley catheter for several weeks.  Patient saw endocrinology on 06/22/2024 for chronic type 2 diabetes with stage III CKD, hypertension and mixed hyperlipidemia.  He is currently on insulin  and oral hypoglycemic agents.  Reports overall, he has been doing well.  Appetite and energy levels are 100%.  Denies any pain.  He continues to use Flomax .  No recent UTIs.  Reports chronic stable hip pain.  SUMMARY OF HEMATOLOGIC HISTORY: Patient is a 69 year old male who is followed by hematology/oncology for anemia secondary to CKD and MGUS.  He was initially sent over to us  by his nephrologist Dr. Rachele for abnormal immunofixation in his urine.  He has since been followed by us .  No crab criteria at this time.  No results found for: CBC  Vitals:   08/24/24 1412 08/24/24 1414  BP: (!) 151/68 137/63  Pulse: 76   Resp: 18   Temp: 97.9 F (  36.6 C)   SpO2: 100%      Review of Systems  Constitutional:  Positive for malaise/fatigue. Negative for weight loss.  Genitourinary:  Positive for dysuria (at times/self caths weekly).  Musculoskeletal:  Positive for joint pain.  Neurological:  Positive for dizziness and sensory change.  Psychiatric/Behavioral:  Negative for depression and hallucinations.     Physical Exam Constitutional:      Appearance: Normal appearance.  Cardiovascular:     Rate and Rhythm: Normal rate and regular rhythm.  Pulmonary:     Effort: Pulmonary effort is normal.     Breath sounds: Normal breath sounds.   Abdominal:     General: Bowel sounds are normal.     Palpations: Abdomen is soft.  Musculoskeletal:        General: No swelling. Normal range of motion.  Neurological:     Mental Status: He is alert and oriented to person, place, and time. Mental status is at baseline.      I spent 25 minutes dedicated to the care of this patient (face-to-face and non-face-to-face) on the date of the encounter to include what is described in the assessment and plan.,  Seth Hope, NP 08/24/2024 2:16 PM "

## 2024-12-15 ENCOUNTER — Other Ambulatory Visit

## 2024-12-21 ENCOUNTER — Ambulatory Visit: Admitting: Nurse Practitioner

## 2024-12-22 ENCOUNTER — Ambulatory Visit: Admitting: Urology

## 2025-02-18 ENCOUNTER — Inpatient Hospital Stay

## 2025-02-25 ENCOUNTER — Inpatient Hospital Stay: Admitting: Oncology
